# Patient Record
Sex: Female | Born: 2005 | Race: White | Hispanic: No | Marital: Single | Smoking: Never smoker
Health system: Southern US, Community
[De-identification: ages and names within clinical notes are randomized; demographics above are authoritative.]

## PROBLEM LIST (undated history)

## (undated) DIAGNOSIS — I10 Essential (primary) hypertension: Secondary | ICD-10-CM

## (undated) DIAGNOSIS — R519 Headache, unspecified: Secondary | ICD-10-CM

## (undated) DIAGNOSIS — Z8759 Personal history of other complications of pregnancy, childbirth and the puerperium: Secondary | ICD-10-CM

## (undated) DIAGNOSIS — Z2839 Other underimmunization status: Secondary | ICD-10-CM

## (undated) DIAGNOSIS — O09899 Supervision of other high risk pregnancies, unspecified trimester: Secondary | ICD-10-CM

## (undated) DIAGNOSIS — F419 Anxiety disorder, unspecified: Secondary | ICD-10-CM

## (undated) HISTORY — DX: Other underimmunization status: Z28.39

## (undated) HISTORY — DX: Essential (primary) hypertension: I10

## (undated) HISTORY — DX: Headache, unspecified: R51.9

## (undated) HISTORY — DX: Personal history of other complications of pregnancy, childbirth and the puerperium: Z87.59

## (undated) HISTORY — PX: NO PAST SURGERIES: SHX2092

## (undated) HISTORY — DX: Anxiety disorder, unspecified: F41.9

## (undated) HISTORY — DX: Supervision of other high risk pregnancies, unspecified trimester: O09.899

---
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
## 2023-01-05 ENCOUNTER — Encounter: Payer: Self-pay | Admitting: *Deleted

## 2023-01-06 ENCOUNTER — Telehealth: Payer: Self-pay

## 2023-01-06 NOTE — Telephone Encounter (Signed)
Mychart msg sent. AS, CMA

## 2023-01-16 DIAGNOSIS — Z419 Encounter for procedure for purposes other than remedying health state, unspecified: Secondary | ICD-10-CM | POA: Diagnosis not present

## 2023-02-02 ENCOUNTER — Other Ambulatory Visit (HOSPITAL_BASED_OUTPATIENT_CLINIC_OR_DEPARTMENT_OTHER): Payer: Self-pay | Admitting: *Deleted

## 2023-02-02 MED ORDER — LABETALOL HCL 100 MG PO TABS: 100.0000 mg | ORAL_TABLET | Freq: Two times a day (BID) | ORAL | 0 refills | Status: DC

## 2023-02-16 DIAGNOSIS — Z419 Encounter for procedure for purposes other than remedying health state, unspecified: Secondary | ICD-10-CM | POA: Diagnosis not present

## 2023-02-22 ENCOUNTER — Encounter (HOSPITAL_BASED_OUTPATIENT_CLINIC_OR_DEPARTMENT_OTHER): Payer: Self-pay | Admitting: Obstetrics & Gynecology

## 2023-02-23 ENCOUNTER — Other Ambulatory Visit (HOSPITAL_BASED_OUTPATIENT_CLINIC_OR_DEPARTMENT_OTHER): Payer: Self-pay | Admitting: Obstetrics & Gynecology

## 2023-02-23 ENCOUNTER — Telehealth (HOSPITAL_BASED_OUTPATIENT_CLINIC_OR_DEPARTMENT_OTHER): Payer: Self-pay | Admitting: Obstetrics & Gynecology

## 2023-02-23 DIAGNOSIS — I10 Essential (primary) hypertension: Secondary | ICD-10-CM

## 2023-02-23 NOTE — Telephone Encounter (Signed)
Pt sent mychart message with question about elevated blood pressure.  She is having some headaches as well.  Just recently switched to a combination OCP.  Advised to stop this and restart the micronor.  Back up method for 7 days after switching recommended.  Irregular bleeding may occur after stopping the combination OCPs early.  Also, recommended rechecking blood pressure in one week and giving me update.  She is taking labetolol BID.  Has stopped breast feeding so this could be adjusted if needed.  Referral to PCP placed as well for pt to get established.

## 2023-03-18 DIAGNOSIS — Z419 Encounter for procedure for purposes other than remedying health state, unspecified: Secondary | ICD-10-CM | POA: Diagnosis not present

## 2023-03-25 ENCOUNTER — Ambulatory Visit (INDEPENDENT_AMBULATORY_CARE_PROVIDER_SITE_OTHER): Payer: Medicaid Other | Admitting: Medical

## 2023-03-25 ENCOUNTER — Encounter (HOSPITAL_BASED_OUTPATIENT_CLINIC_OR_DEPARTMENT_OTHER): Payer: Self-pay | Admitting: Medical

## 2023-03-25 VITALS — BP 134/89 | HR 91 | Ht 65.0 in | Wt 220.4 lb

## 2023-03-25 DIAGNOSIS — I10 Essential (primary) hypertension: Secondary | ICD-10-CM

## 2023-03-25 DIAGNOSIS — Z0130 Encounter for examination of blood pressure without abnormal findings: Secondary | ICD-10-CM

## 2023-03-25 DIAGNOSIS — Z30011 Encounter for initial prescription of contraceptive pills: Secondary | ICD-10-CM | POA: Diagnosis not present

## 2023-03-25 MED ORDER — LISINOPRIL 10 MG PO TABS: 10.0000 mg | ORAL_TABLET | Freq: Every day | ORAL | 3 refills | Status: DC

## 2023-03-25 MED ORDER — NORETHINDRONE 0.35 MG PO TABS: 1.0000 | ORAL_TABLET | Freq: Every day | ORAL | 3 refills | Status: DC

## 2023-03-25 NOTE — Progress Notes (Signed)
  History:  Ms. Kristi Lowe is a 17 y.o. G1P1001 who presents to clinic today for BP check. She was on OCPs and noted frequent HA. She has ability to check BP at home and HTN was noted. She is on Labetalol for HTN. She is not breastfeeding. She called the office due to HA and HTN and was told to go back to taking Micronor, however pharmacy gave her the wrong Rx so she hasn't been on any birth control since 02/23/23. LMP 03/21/23 was normal, and condoms have been used, so we can be reasonably certain that she is not pregnant and safe to restart the Micronor.  HA has been at least monthly and associated most recently with BP 140s/90s at home. She denies visual changes or chest pain.   The following portions of the patient's history were reviewed and updated as appropriate: allergies, current medications, family history, past medical history, social history, past surgical history and problem list.  Review of Systems:  Review of Systems  Cardiovascular:  Negative for chest pain.  Gastrointestinal:  Negative for abdominal pain.  Genitourinary:        Neg - vaginal bleeding  Neurological:  Positive for headaches.      Objective:  Physical Exam BP 134/89 (BP Location: Right Arm, Patient Position: Sitting, Cuff Size: Large)   Pulse 91   Ht 5\' 5"  (1.651 m) Comment: Reported  Wt 220 lb 6.4 oz (100 kg)   LMP 03/21/2023   BMI 36.68 kg/m  Physical Exam Vitals reviewed.  Constitutional:      Appearance: Normal appearance. She is obese. She is not ill-appearing.  Cardiovascular:     Rate and Rhythm: Normal rate.  Pulmonary:     Effort: Pulmonary effort is normal.  Abdominal:     General: Abdomen is flat.     Palpations: Abdomen is soft.  Skin:    General: Skin is warm and dry.     Findings: No erythema.  Neurological:     Mental Status: She is alert and oriented to person, place, and time.  Psychiatric:        Mood and Affect: Mood normal.     Health Maintenance Due  Topic Date Due    COVID-19 Vaccine (1) Never done   HPV VACCINES (1 - 2-dose series) Never done    Labs, imaging and previous visits in Epic and Care Everywhere reviewed  Assessment & Plan:  1. BP check - BP normal today, but not well-controlled at home on current regimen of Labetalol, will switch to Lisinopril since not breastfeeding as this should provide better control. Patient is agreeable.   2. Hypertension, unspecified type - lisinopril (PRINIVIL) 10 MG tablet; Take 1 tablet (10 mg total) by mouth daily.  Dispense: 30 tablet; Refill: 3 - Ambulatory referral to [facility] - Warning signs for worsening HTN discussed as well as when to seek emergency medical care   3. Encounter for initial prescription of contraceptive pills - norethindrone (MICRONOR) 0.35 MG tablet; Take 1 tablet (0.35 mg total) by mouth daily.  Dispense: 84 tablet; Refill: 3 - Restart POPs on Sunday and use condoms for at least the first month to ensure adequate coverage.    Return in about 2 weeks (around 04/08/2023) for BP check with RN/CMA only .  Marny Lowenstein, PA-C 03/25/2023 3:26 PM

## 2023-03-26 ENCOUNTER — Ambulatory Visit (HOSPITAL_BASED_OUTPATIENT_CLINIC_OR_DEPARTMENT_OTHER): Payer: Self-pay | Admitting: Obstetrics & Gynecology

## 2023-04-02 ENCOUNTER — Encounter (HOSPITAL_BASED_OUTPATIENT_CLINIC_OR_DEPARTMENT_OTHER): Payer: Medicaid Other

## 2023-04-02 ENCOUNTER — Telehealth (HOSPITAL_BASED_OUTPATIENT_CLINIC_OR_DEPARTMENT_OTHER): Payer: Self-pay | Admitting: *Deleted

## 2023-04-02 NOTE — Telephone Encounter (Signed)
Called patient and left a message to see if she was on her way to get her BP.

## 2023-04-08 ENCOUNTER — Ambulatory Visit (HOSPITAL_BASED_OUTPATIENT_CLINIC_OR_DEPARTMENT_OTHER): Payer: Medicaid Other | Admitting: Family Medicine

## 2023-04-08 ENCOUNTER — Encounter (HOSPITAL_BASED_OUTPATIENT_CLINIC_OR_DEPARTMENT_OTHER): Payer: Self-pay | Admitting: Family Medicine

## 2023-04-15 ENCOUNTER — Ambulatory Visit (HOSPITAL_BASED_OUTPATIENT_CLINIC_OR_DEPARTMENT_OTHER): Payer: Medicaid Other | Admitting: Family Medicine

## 2023-04-16 ENCOUNTER — Ambulatory Visit (INDEPENDENT_AMBULATORY_CARE_PROVIDER_SITE_OTHER): Payer: Medicaid Other | Admitting: Family Medicine

## 2023-04-16 ENCOUNTER — Encounter (HOSPITAL_BASED_OUTPATIENT_CLINIC_OR_DEPARTMENT_OTHER): Payer: Self-pay | Admitting: Family Medicine

## 2023-04-16 VITALS — BP 135/80 | HR 81 | Ht 65.0 in | Wt 224.8 lb

## 2023-04-16 DIAGNOSIS — R635 Abnormal weight gain: Secondary | ICD-10-CM | POA: Diagnosis not present

## 2023-04-16 DIAGNOSIS — F411 Generalized anxiety disorder: Secondary | ICD-10-CM

## 2023-04-16 DIAGNOSIS — I10 Essential (primary) hypertension: Secondary | ICD-10-CM | POA: Diagnosis not present

## 2023-04-16 NOTE — Progress Notes (Signed)
New Patient Office Visit  Subjective    Patient ID: Kristi Lowe, female    DOB: October 16, 2006  Age: 17 y.o. MRN: [id number]  HPI Kristi Lowe is a 17 year-old female who presents to establish care. She has a history of generalized anxiety disorder and hypertension. She is currently on progestin-only contraception Geophysicist/field seismologist). She has concerns about recent weight gain, despite healthy diet and daily exercise. She has a 18 month-old girl who is a well tempered and is sleeping well throughout the night.   OCP- [facility]   HTN- was taking labetalol 100mg  switched to lisinopril 10mg  daily. 03/25/2023: office visit with OBGYN for BP follow-up, she was not informed to stop taking the labetalol and start taking the lisinopril. She did not understand these instructions and had multiple things happen and was unable to contact the office. Therefore, she has been taking labetalol 100mg  twice daily. Patient reports she is not currently breastfeeding. Reports her home blood pressure readings are in the 130-140s/80-90s.   GAD- not currently taking any medications. She is dealing with an increase in life stressors- her mom is currently in the hospital.      04/16/2023    8:38 AM 06/13/2022    9:55 AM 02/11/2022    3:53 PM  GAD 7 : Generalized Anxiety Score  Nervous, Anxious, on Edge 0 0 0  Control/stop worrying 0 0 0  Worry too much - different things 0 0 0  Trouble relaxing 0 0 0  Restless 0 0 0  Easily annoyed or irritable 0 1 0  Afraid - awful might happen 0 0 0  Total GAD 7 Score 0 1 0  Anxiety Difficulty Not difficult at all         04/16/2023    8:38 AM 03/25/2023    3:09 PM 06/13/2022    9:55 AM 02/11/2022    3:53 PM  Depression screen PHQ 2/9  Decreased Interest 0 0 0 0  Down, Depressed, Hopeless 0 0 0 0  PHQ - 2 Score 0 0 0 0  Altered sleeping   0 0  Tired, decreased energy   1 1  Change in appetite   0 0  Feeling bad or failure about yourself    0 0  Trouble concentrating   0 0  Moving  slowly or fidgety/restless   0 0  Suicidal thoughts   0 0  PHQ-9 Score   1 1     Outpatient Encounter Medications as of 04/16/2023  Medication Sig   lisinopril (PRINIVIL) 10 MG tablet Take 1 tablet (10 mg total) by mouth daily.   norethindrone (MICRONOR) 0.35 MG tablet Take 1 tablet (0.35 mg total) by mouth daily.   [DISCONTINUED] labetalol (NORMODYNE) 100 MG tablet Take 1 tablet (100 mg total) by mouth 2 (two) times daily.   [DISCONTINUED] docusate sodium (COLACE) 100 MG capsule Take 1 capsule (100 mg total) by mouth 2 (two) times daily. (Patient not taking: Reported on 10/02/2022)   [DISCONTINUED] ibuprofen (ADVIL) 600 MG tablet Take 1 tablet (600 mg total) by mouth every 6 (six) hours. (Patient not taking: Reported on 03/25/2023)   [DISCONTINUED] Prenatal Vit-Fe Fumarate-FA (PRENATAL VITAMINS) 28-0.8 MG TABS Take 1 tablet by mouth daily. (Patient not taking: Reported on 03/25/2023)   No facility-administered encounter medications on file as of 04/16/2023.    Past Medical History:  Diagnosis Date   Anxiety    Headache    History of gestational hypertension    Rubella non-immune status, antepartum  Past Surgical History:  Procedure Laterality Date   NO PAST SURGERIES      Family History  Problem Relation Age of Onset   Anxiety disorder Mother    ADD / ADHD Mother    Anxiety disorder Maternal Grandmother    Migraines Maternal Grandmother    Aneurysm Maternal Grandmother    Hypertension Paternal Grandfather    Asthma Neg Hx    Cancer Neg Hx    Diabetes Neg Hx    Heart disease Neg Hx     Social History   Socioeconomic History   Marital status: Single    Spouse name: Not on file   Number of children: Not on file   Years of education: Not on file   Highest education level: Not on file  Occupational History    Comment: [employer]  Tobacco Use   Smoking status: Never    Passive exposure: Never   Smokeless tobacco: Never  Vaping Use   Vaping Use: Never used  Substance  and Sexual Activity   Alcohol use: Never   Drug use: Never   Sexual activity: Yes    Birth control/protection: OCP    Comment: POPs  Other Topics Concern   Not on file  Social History Narrative   Danilyn lives with mom, dad, and sister.    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 She does not the best, but not the worst in school.    She would like to go to a community college and the transfer to a university for Public relations account executive.    Social Determinants of Health   Financial Resource Strain: Low Risk  (02/11/2022)   Overall Financial Resource Strain (CARDIA)    Difficulty of Paying Living Expenses: Not hard at all  Food Insecurity: No Food Insecurity (08/19/2022)   Hunger Vital Sign    Worried About Running Out of Food in the Last Year: Never true    Ran Out of Food in the Last Year: Never true  Transportation Needs: No Transportation Needs (08/19/2022)   PRAPARE - Administrator, Civil Service (Medical): No    Lack of Transportation (Non-Medical): No  Physical Activity: Sufficiently Active (02/11/2022)   Exercise Vital Sign    Days of Exercise per Week: 6 days    Minutes of Exercise per Session: 30 min  Stress: No Stress Concern Present (02/11/2022)   [employer] of Occupational Health - Occupational Stress Questionnaire    Feeling of Stress : Not at all  Social Connections: Socially Isolated (02/11/2022)   Social Connection and Isolation Panel [NHANES]    Frequency of Communication with Friends and Family: More than three times a week    Frequency of Social Gatherings with Friends and Family: Three times a week    Attends Religious Services: Never    Active Member of Clubs or Organizations: No    Attends Banker Meetings: Never    Marital Status: Never married  Intimate Partner Violence: Not At Risk (08/19/2022)   Humiliation, Afraid, Rape, and Kick questionnaire    Fear of Current or Ex-Partner: No    Emotionally Abused: No    Physically  Abused: No    Sexually Abused: No    Review of Systems  Constitutional:  Negative for malaise/fatigue.  Eyes:  Negative for blurred vision and double vision.  Respiratory:  Negative for cough and shortness of breath.   Cardiovascular:  Negative for chest pain, palpitations and orthopnea.  Gastrointestinal:  Negative for  abdominal pain, nausea and vomiting.  Musculoskeletal:  Negative for myalgias.  Neurological:  Negative for dizziness, weakness and headaches.  Psychiatric/Behavioral:  Negative for depression, substance abuse and suicidal ideas. The patient is not nervous/anxious and does not have insomnia.    Objective    BP 135/80   Pulse 81   Ht 5\' 5"  (1.651 m)   Wt 224 lb 12.8 oz (102 kg)   LMP 03/21/2023 (Approximate)   SpO2 100%   Breastfeeding No   BMI 37.41 kg/m   Physical Exam Constitutional:      Appearance: Normal appearance.  Cardiovascular:     Rate and Rhythm: Normal rate and regular rhythm.     Pulses: Normal pulses.     Heart sounds: Normal heart sounds.  Pulmonary:     Effort: Pulmonary effort is normal.     Breath sounds: Normal breath sounds.  Neurological:     Mental Status: She is alert.  Psychiatric:        Mood and Affect: Mood normal.        Behavior: Behavior normal.        Thought Content: Thought content normal.        Judgment: Judgment normal.    Assessment & Plan:   1. Hypertension, unspecified type Patient presents today with well-controlled blood pressure. Patient in no acute distress and is well-appearing. Denies chest pain, shortness of breath, lower extremity edema, vision changes, headaches. Cardiovascular exam with heart regular rate and rhythm. Normal heart sounds, no murmurs present. No lower extremity edema present. Lungs clear to auscultation bilaterally. Patient is currently taking labetalol 100mg  twice daily. Discussed starting lisinopril 10mg  daily. Advised her to continue monitoring blood pressures at home- blood pressure  log provided to patient. No refills needed. Return to office sooner if blood pressure begins to increase greater than 130/80. Follow-up in 4 weeks. Will assess kidney function prior to starting lisinopril.  - Comprehensive metabolic panel  2. Weight gain Patient reports she has noticed an increase in weight over the past few months. She reports that she eats a healthy variety of foods, limits unhealthy foods, and exercises daily. She reports that she did stop breastfeeding recently. Discussed this may contribute to an increase in her weight. Will assess her thyroid function today.  - TSH Rfx on Abnormal to Free T4  3. Generalized anxiety disorder Patient reports she has a history of generalized anxiety disorder. GAD7 and PHQ2 completed with a score of 0 on each questionnaire. Denies SI/HI. She reports she is dealing with an increase in life stressors and would like to be referred for cognitive behavioral therapy (CBT). Advised her to return to office if she starts to feel her anxiety is impacting her quality of life and daily activities. Will follow-up at visit in 4 weeks to discuss status of referral.  - Ambulatory referral to Psychology    Return in about 4 weeks (around 05/14/2023) for HTN & mood follow-up.   Alyson Reedy, FNP

## 2023-04-17 ENCOUNTER — Encounter (HOSPITAL_BASED_OUTPATIENT_CLINIC_OR_DEPARTMENT_OTHER): Payer: Self-pay | Admitting: Family Medicine

## 2023-04-17 LAB — COMPREHENSIVE METABOLIC PANEL
ALT: 18 IU/L (ref 0–32)
AST: 12 IU/L (ref 0–40)
Albumin/Globulin Ratio: 1.7 (ref 1.2–2.2)
Albumin: 4.3 g/dL (ref 4.0–5.0)
Alkaline Phosphatase: 107 IU/L — ABNORMAL HIGH (ref 42–106)
BUN/Creatinine Ratio: 21 (ref 9–23)
BUN: 15 mg/dL (ref 6–20)
Bilirubin Total: 0.2 mg/dL (ref 0.0–1.2)
CO2: 21 mmol/L (ref 20–29)
Calcium: 9.5 mg/dL (ref 8.7–10.2)
Chloride: 105 mmol/L (ref 96–106)
Creatinine, Ser: 0.71 mg/dL (ref 0.57–1.00)
Globulin, Total: 2.6 g/dL (ref 1.5–4.5)
Glucose: 105 mg/dL — ABNORMAL HIGH (ref 70–99)
Potassium: 4.8 mmol/L (ref 3.5–5.2)
Sodium: 141 mmol/L (ref 134–144)
Total Protein: 6.9 g/dL (ref 6.0–8.5)
eGFR: 126 mL/min/{1.73_m2} (ref >59)

## 2023-04-17 LAB — TSH RFX ON ABNORMAL TO FREE T4: TSH: 1.19 u[IU]/mL (ref 0.450–4.500)

## 2023-04-18 DIAGNOSIS — Z419 Encounter for procedure for purposes other than remedying health state, unspecified: Secondary | ICD-10-CM | POA: Diagnosis not present

## 2023-04-29 ENCOUNTER — Encounter (HOSPITAL_BASED_OUTPATIENT_CLINIC_OR_DEPARTMENT_OTHER): Payer: Self-pay | Admitting: Family Medicine

## 2023-05-01 ENCOUNTER — Ambulatory Visit (HOSPITAL_BASED_OUTPATIENT_CLINIC_OR_DEPARTMENT_OTHER): Payer: Medicaid Other | Admitting: Family Medicine

## 2023-05-06 ENCOUNTER — Encounter (HOSPITAL_BASED_OUTPATIENT_CLINIC_OR_DEPARTMENT_OTHER): Payer: Self-pay | Admitting: Family Medicine

## 2023-05-06 ENCOUNTER — Telehealth (INDEPENDENT_AMBULATORY_CARE_PROVIDER_SITE_OTHER): Payer: Medicaid Other | Admitting: Family Medicine

## 2023-05-06 VITALS — BP 115/108 | HR 79 | Ht 65.0 in | Wt 225.0 lb

## 2023-05-06 DIAGNOSIS — F411 Generalized anxiety disorder: Secondary | ICD-10-CM

## 2023-05-06 DIAGNOSIS — I10 Essential (primary) hypertension: Secondary | ICD-10-CM

## 2023-05-06 MED ORDER — FLUOXETINE HCL 10 MG PO TABS: 10.0000 mg | ORAL_TABLET | Freq: Every day | ORAL | 2 refills | Status: DC

## 2023-05-06 MED ORDER — LISINOPRIL 20 MG PO TABS: 20.0000 mg | ORAL_TABLET | Freq: Every day | ORAL | 2 refills | Status: DC

## 2023-05-06 NOTE — Progress Notes (Unsigned)
Virtual Visit via Video Note  I connected with Kristi Lowe on 05/07/23 at 8:00 AM by a video enabled telemedicine application and verified that I am speaking with the correct person using two identifiers. She presents for concerns about anxiety.    Patient Location: Home Provider Location: Office/Clinic  I discussed the limitations, risks, security, and privacy concerns of performing an evaluation and management service by video and the availability of in person appointments. I also discussed with the patient that there may be a patient responsible charge related to this service. The patient expressed understanding and agreed to proceed.  Reports she is doing "about the same" as her last visit. Reports she has noticed an increase in her anxiety.  GAD7 score today of 4. PHQ9 score of 0 Notices physical manifestations of her anxiety depending on what she is doing- difficulty driving, "feeling it" in her chest, "stomach turns," heart rate increases. Sometimes she notices her anxiety increase when she is not doing anything. Has not been on medications in the past. In college now, going to school and attending in-person class makes her nervous and makes her anxiety increase. She has been on propranolol in the past for headaches but noticed this did help improve her symptoms.   BP at home today- 125/107      05/06/2023    1:41 PM 04/16/2023    8:38 AM 06/13/2022    9:55 AM 02/11/2022    3:53 PM  GAD 7 : Generalized Anxiety Score  Nervous, Anxious, on Edge 2 0 0 0  Control/stop worrying 0 0 0 0  Worry too much - different things 2 0 0 0  Trouble relaxing 0 0 0 0  Restless 0 0 0 0  Easily annoyed or irritable 0 0 1 0  Afraid - awful might happen 0 0 0 0  Total GAD 7 Score 4 0 1 0  Anxiety Difficulty Not difficult at all Not difficult at all          05/06/2023    1:42 PM 04/16/2023    8:38 AM 03/25/2023    3:09 PM 06/13/2022    9:55 AM 02/11/2022    3:53 PM  Depression screen PHQ 2/9   Decreased Interest 0 0 0 0 0  Down, Depressed, Hopeless 0 0 0 0 0  PHQ - 2 Score 0 0 0 0 0  Altered sleeping 0   0 0  Tired, decreased energy 0   1 1  Change in appetite 0   0 0  Feeling bad or failure about yourself  0   0 0  Trouble concentrating 0   0 0  Moving slowly or fidgety/restless 0   0 0  Suicidal thoughts 0   0 0  PHQ-9 Score 0   1 1  Difficult doing work/chores Not difficult at all        ROS: Per HPI  Current Outpatient Medications:    FLUoxetine (PROZAC) 10 MG tablet, Take 1 tablet (10 mg total) by mouth daily., Disp: 30 tablet, Rfl: 2   norethindrone (MICRONOR) 0.35 MG tablet, Take 1 tablet (0.35 mg total) by mouth daily., Disp: 84 tablet, Rfl: 3   lisinopril (PRINIVIL) 20 MG tablet, Take 1 tablet (20 mg total) by mouth daily., Disp: 30 tablet, Rfl: 2  Observations/Objective: Today's Vitals   05/06/23 1335  BP: (!) 115/108  Pulse: 79  Weight: 225 lb (102.1 kg)  Height: 5\' 5"  (1.651 m)    General: Speaking full  sentences, no audible heavy breathing.  Sounds alert and appropriately interactive.  Appears well.  Face symmetric.  Extraocular movements intact.  Pupils equal and round.  No nasal flaring or accessory muscle use visualized.  Assessment and Plan:  1. Generalized anxiety disorder Patient reports an increase in anxiety within the past month. GAD7 and PHQ9 completed. GAD7 indicates mild anxiety. PHQ9 completed with score of 0. Denies SI/HI. Discussed options, would like to proceed with starting first-line medication. Educated about mechanism of action, common side effects, daily adherence to medication, and that improvement in mood may take 4-6 weeks. Placed ordered for fluoxetine 10mg  daily. Plan to follow-up in 4-6 weeks for mood.   - FLUoxetine (PROZAC) 10 MG tablet; Take 1 tablet (10 mg total) by mouth daily.  Dispense: 30 tablet; Refill: 2  2. Hypertension, unspecified type Patient has elevated diastolic blood pressure at home. Patient in no acute  distress and is well-appearing. Denies chest pain, shortness of breath, lower extremity edema, vision changes, headaches. Advised patient to closely monitor blood pressure. Plan to increase lisinopril from 10mg  daily to 20mg  daily.  Advised patient to continue to monitor blood pressures at home and return to office sooner if blood pressure begins to increase greater than 130/80. Follow-up in 4 weeks.     Follow Up Instructions: Return in about 4 weeks (around 06/03/2023) for Mood f/u, HTN follow-up.   I discussed the assessment and treatment plan with the patient. The patient was provided an opportunity to ask questions, and all were answered. The patient agreed with the plan and demonstrated an understanding of the instructions.   The patient was advised to call back or seek an in-person evaluation if the symptoms worsen or if the condition fails to improve as anticipated.  The above assessment and management plan was discussed with the patient. The patient verbalized understanding of and has agreed to the management plan.   Alyson Reedy, FNP

## 2023-05-08 ENCOUNTER — Ambulatory Visit (HOSPITAL_BASED_OUTPATIENT_CLINIC_OR_DEPARTMENT_OTHER): Payer: Medicaid Other | Admitting: Family Medicine

## 2023-05-08 ENCOUNTER — Encounter (HOSPITAL_BASED_OUTPATIENT_CLINIC_OR_DEPARTMENT_OTHER): Payer: Self-pay | Admitting: Family Medicine

## 2023-05-13 ENCOUNTER — Encounter (HOSPITAL_BASED_OUTPATIENT_CLINIC_OR_DEPARTMENT_OTHER): Payer: Self-pay

## 2023-05-13 ENCOUNTER — Other Ambulatory Visit (HOSPITAL_BASED_OUTPATIENT_CLINIC_OR_DEPARTMENT_OTHER): Payer: Self-pay | Admitting: Family Medicine

## 2023-05-13 MED ORDER — SERTRALINE HCL 25 MG PO TABS: 25.0000 mg | ORAL_TABLET | Freq: Every day | ORAL | 2 refills | Status: DC

## 2023-05-14 ENCOUNTER — Other Ambulatory Visit (HOSPITAL_BASED_OUTPATIENT_CLINIC_OR_DEPARTMENT_OTHER): Payer: Self-pay

## 2023-05-14 ENCOUNTER — Encounter (HOSPITAL_BASED_OUTPATIENT_CLINIC_OR_DEPARTMENT_OTHER): Payer: Self-pay | Admitting: Family Medicine

## 2023-05-15 ENCOUNTER — Ambulatory Visit (HOSPITAL_BASED_OUTPATIENT_CLINIC_OR_DEPARTMENT_OTHER): Payer: Medicaid Other | Admitting: Family Medicine

## 2023-05-18 DIAGNOSIS — Z419 Encounter for procedure for purposes other than remedying health state, unspecified: Secondary | ICD-10-CM | POA: Diagnosis not present

## 2023-05-29 ENCOUNTER — Encounter (HOSPITAL_BASED_OUTPATIENT_CLINIC_OR_DEPARTMENT_OTHER): Payer: Self-pay | Admitting: Family Medicine

## 2023-06-06 DIAGNOSIS — Z111 Encounter for screening for respiratory tuberculosis: Secondary | ICD-10-CM | POA: Diagnosis not present

## 2023-06-09 ENCOUNTER — Encounter (HOSPITAL_BASED_OUTPATIENT_CLINIC_OR_DEPARTMENT_OTHER): Payer: Self-pay | Admitting: Family Medicine

## 2023-06-09 ENCOUNTER — Other Ambulatory Visit (HOSPITAL_BASED_OUTPATIENT_CLINIC_OR_DEPARTMENT_OTHER): Payer: Self-pay

## 2023-06-09 DIAGNOSIS — I10 Essential (primary) hypertension: Secondary | ICD-10-CM

## 2023-06-09 MED ORDER — FLUOXETINE HCL 10 MG PO TABS: 10.0000 mg | ORAL_TABLET | Freq: Every day | ORAL | 0 refills | Status: DC

## 2023-06-09 MED ORDER — LISINOPRIL 20 MG PO TABS: 20.0000 mg | ORAL_TABLET | Freq: Every day | ORAL | 2 refills | Status: DC

## 2023-06-18 DIAGNOSIS — Z419 Encounter for procedure for purposes other than remedying health state, unspecified: Secondary | ICD-10-CM | POA: Diagnosis not present

## 2023-06-24 ENCOUNTER — Ambulatory Visit (INDEPENDENT_AMBULATORY_CARE_PROVIDER_SITE_OTHER): Payer: Medicaid Other | Admitting: Family Medicine

## 2023-06-24 ENCOUNTER — Encounter (HOSPITAL_BASED_OUTPATIENT_CLINIC_OR_DEPARTMENT_OTHER): Payer: Self-pay | Admitting: Family Medicine

## 2023-06-24 VITALS — BP 128/70 | Ht 65.0 in | Wt 220.0 lb

## 2023-06-24 DIAGNOSIS — F411 Generalized anxiety disorder: Secondary | ICD-10-CM | POA: Diagnosis not present

## 2023-06-24 MED ORDER — FLUOXETINE HCL 20 MG PO TABS: 20.0000 mg | ORAL_TABLET | Freq: Every day | ORAL | 2 refills | Status: DC

## 2023-06-24 NOTE — Progress Notes (Signed)
Established Patient Office Visit  Subjective   Patient ID: Kristi Lowe, female    DOB: November 08, 2006  Age: 17 y.o. MRN: [id number]  ANXIETY:  Kristi Lowe is a 17 year-old female patient who presents for the medical management of anxiety.  Current medication regimen: prozac 10mg , no side effects with medication  Counseling: trying to schedule, interested  Well controlled: does not see much of a difference with current dose  Denies SI/HI.      06/24/2023    8:50 AM 05/06/2023    1:41 PM 04/16/2023    8:38 AM 06/13/2022    9:55 AM  GAD 7 : Generalized Anxiety Score  Nervous, Anxious, on Edge 2 2 0 0  Control/stop worrying 1 0 0 0  Worry too much - different things 1 2 0 0  Trouble relaxing 0 0 0 0  Restless 0 0 0 0  Easily annoyed or irritable 0 0 0 1  Afraid - awful might happen 0 0 0 0  Total GAD 7 Score 4 4 0 1  Anxiety Difficulty Not difficult at all Not difficult at all Not difficult at all       06/24/2023    8:50 AM 05/06/2023    1:42 PM 04/16/2023    8:38 AM  PHQ9 SCORE ONLY  PHQ-9 Total Score 0 0 0    Review of Systems  Constitutional:  Negative for malaise/fatigue.  Eyes:  Negative for blurred vision and double vision.  Respiratory:  Negative for cough and shortness of breath.   Cardiovascular:  Negative for chest pain and palpitations.  Gastrointestinal:  Negative for abdominal pain, nausea and vomiting.  Musculoskeletal:  Negative for myalgias.  Neurological:  Negative for dizziness, weakness and headaches.  Psychiatric/Behavioral:  Negative for depression and suicidal ideas. The patient is nervous/anxious. The patient does not have insomnia.       Objective:     BP 128/70   Ht 5\' 5"  (1.651 m)   Wt 220 lb (99.8 kg)   SpO2 100%   BMI 36.61 kg/m  BP Readings from Last 3 Encounters:  06/24/23 128/70  05/06/23 (!) 115/108  04/16/23 135/80     Physical Exam Constitutional:      Appearance: Normal appearance.  Cardiovascular:     Rate and Rhythm:  Normal rate and regular rhythm.     Pulses: Normal pulses.     Heart sounds: Normal heart sounds.  Pulmonary:     Effort: Pulmonary effort is normal.     Breath sounds: Normal breath sounds.  Neurological:     Mental Status: She is alert.  Psychiatric:        Mood and Affect: Mood normal.        Behavior: Behavior normal.        Thought Content: Thought content normal.        Judgment: Judgment normal.       Assessment & Plan:  Generalized anxiety disorder Assessment & Plan: Patient presents today for medical management of anxiety. She has been taking fluoxetine 10mg  daily as prescribed with no adverse side effects. GAD7 score completed today, with no improvement from last visit. Patient reports she does not notice a worsening in her mood, but also does not notice her mood worsening. Discussed options, she would like to increase dose from 10mg  to 20mg . Refill sent to pharmacy. Plan to follow-up in 6 weeks for mood.  Orders: -     Ambulatory referral to Behavioral Health  Other orders -  FLUoxetine HCl; Take 1 tablet (20 mg total) by mouth daily.  Dispense: 30 tablet; Refill: 2     Return in about 6 weeks (around 08/05/2023) for Mood f/u.    Alyson Reedy, FNP

## 2023-06-24 NOTE — Assessment & Plan Note (Addendum)
Patient presents today for medical management of anxiety. She has been taking fluoxetine 10mg  daily as prescribed with no adverse side effects. GAD7 score completed today, with no improvement from last visit. Patient reports she does not notice a worsening in her mood, but also does not notice her mood worsening. Discussed options, she would like to increase dose from 10mg  to 20mg . Refill sent to pharmacy. Plan to follow-up in 6 weeks for mood.

## 2023-07-14 ENCOUNTER — Encounter (HOSPITAL_BASED_OUTPATIENT_CLINIC_OR_DEPARTMENT_OTHER): Payer: Self-pay | Admitting: Family Medicine

## 2023-07-14 ENCOUNTER — Other Ambulatory Visit (HOSPITAL_BASED_OUTPATIENT_CLINIC_OR_DEPARTMENT_OTHER): Payer: Self-pay

## 2023-07-14 DIAGNOSIS — I10 Essential (primary) hypertension: Secondary | ICD-10-CM

## 2023-07-14 MED ORDER — LISINOPRIL 20 MG PO TABS: 20.0000 mg | ORAL_TABLET | Freq: Every day | ORAL | 2 refills | Status: DC

## 2023-07-19 DIAGNOSIS — Z419 Encounter for procedure for purposes other than remedying health state, unspecified: Secondary | ICD-10-CM | POA: Diagnosis not present

## 2023-08-05 ENCOUNTER — Ambulatory Visit (HOSPITAL_BASED_OUTPATIENT_CLINIC_OR_DEPARTMENT_OTHER): Payer: Medicaid Other | Admitting: Family Medicine

## 2023-08-18 DIAGNOSIS — Z419 Encounter for procedure for purposes other than remedying health state, unspecified: Secondary | ICD-10-CM | POA: Diagnosis not present

## 2023-08-25 ENCOUNTER — Ambulatory Visit (HOSPITAL_COMMUNITY): Payer: Medicaid Other | Admitting: Mental Health

## 2023-09-17 ENCOUNTER — Encounter (HOSPITAL_BASED_OUTPATIENT_CLINIC_OR_DEPARTMENT_OTHER): Payer: Self-pay | Admitting: Family Medicine

## 2023-09-18 DIAGNOSIS — Z419 Encounter for procedure for purposes other than remedying health state, unspecified: Secondary | ICD-10-CM | POA: Diagnosis not present

## 2023-09-22 ENCOUNTER — Ambulatory Visit (HOSPITAL_BASED_OUTPATIENT_CLINIC_OR_DEPARTMENT_OTHER): Payer: Medicaid Other | Admitting: Family Medicine

## 2023-09-22 ENCOUNTER — Encounter (HOSPITAL_BASED_OUTPATIENT_CLINIC_OR_DEPARTMENT_OTHER): Payer: Self-pay | Admitting: Family Medicine

## 2023-10-01 ENCOUNTER — Ambulatory Visit (HOSPITAL_BASED_OUTPATIENT_CLINIC_OR_DEPARTMENT_OTHER): Payer: Medicaid Other | Admitting: Obstetrics & Gynecology

## 2023-10-01 ENCOUNTER — Encounter (HOSPITAL_BASED_OUTPATIENT_CLINIC_OR_DEPARTMENT_OTHER): Payer: Self-pay | Admitting: Obstetrics & Gynecology

## 2023-10-01 VITALS — BP 125/61 | HR 89 | Wt 227.8 lb

## 2023-10-01 DIAGNOSIS — Z3202 Encounter for pregnancy test, result negative: Secondary | ICD-10-CM

## 2023-10-01 DIAGNOSIS — N926 Irregular menstruation, unspecified: Secondary | ICD-10-CM

## 2023-10-01 MED ORDER — IBUPROFEN 800 MG PO TABS: 800.0000 mg | ORAL_TABLET | Freq: Three times a day (TID) | ORAL | 0 refills | Status: DC | PRN

## 2023-10-01 MED ORDER — PRENATAL PLUS VITAMIN/MINERAL 27-1 MG PO TABS: 1.0000 | ORAL_TABLET | Freq: Every day | ORAL | 12 refills | Status: DC

## 2023-10-01 NOTE — Progress Notes (Signed)
GYNECOLOGY  VISIT  CC:   abnormal bleeding/ faint UPT at home two weeks ago  HPI: 17 y.o. G74P1001 Single White or Caucasian female here for concerns about bleeding that started this morning.  Reports about two weeks ago she missed her menstrual cycle and she took a UPT.  It was faintly positive.  She repeated it the next day and it was again faintly positive.  Hasn't been feeling great so felt this was eary pregnancy.  Daughter is 10 year old.  Not actively trying for pregnancy but not actively preventing either. l   Past Medical History:  Diagnosis Date   Anxiety    Headache    History of gestational hypertension    Rubella non-immune status, antepartum     MEDS:   Current Outpatient Medications on File Prior to Visit  Medication Sig Dispense Refill   FLUoxetine (PROZAC) 20 MG tablet Take 1 tablet (20 mg total) by mouth daily. 30 tablet 2   lisinopril (PRINIVIL) 20 MG tablet Take 1 tablet (20 mg total) by mouth daily. 30 tablet 2   norethindrone (MICRONOR) 0.35 MG tablet Take 1 tablet (0.35 mg total) by mouth daily. (Patient not taking: Reported on 10/01/2023) 84 tablet 3   No current facility-administered medications on file prior to visit.    ALLERGIES: Patient has no known allergies.  SH:  single, non smoker  Review of Systems  Constitutional: Negative.   Genitourinary:        Abnormal bleeding    PHYSICAL EXAMINATION:    BP 125/61 (BP Location: Right Arm, Patient Position: Sitting, Cuff Size: Large)   Pulse 89   Wt 227 lb 12.8 oz (103.3 kg)   BMI 37.91 kg/m     General appearance: alert, cooperative and appears stated age Lymph:  no inguinal LAD noted  Pelvic: External genitalia:  no lesions              Urethra:  normal appearing urethra with no masses, tenderness or lesions              Bartholins and Skenes: normal                 Vagina: normal mucosa without prolapse or lesions              Cervix: no lesions and blood at os, no clots noted               Bimanual Exam:  Uterus:  normal size, contour, position, consistency, mobility, non-tender              Adnexa: no mass, fullness, tenderness  Chaperone, Raechel Ache, RN, was present for exam.  Assessment/Plan: 1. Urine pregnancy test negative - pt aware UPT here is negative.  Will check HCG.  Discussed possible chemical pregnancy or false positive UPT at home.  Will follow up with additional recommendations for pt once lab work is back. - MBT is O+ - Prenatal Vit-Fe Fumarate-FA (PRENATAL PLUS VITAMIN/MINERAL) 27-1 MG TABS; Take 1 tablet by mouth daily in the afternoon.  Dispense: 30 tablet; Refill: 12  2. Abnormal bleeding in menstrual cycle - ibuprofen (ADVIL) 800 MG tablet; Take 1 tablet (800 mg total) by mouth every 8 (eight) hours as needed.  Dispense: 30 tablet; Refill: 0

## 2023-10-02 LAB — BETA HCG QUANT (REF LAB): hCG Quant: <1 m[IU]/mL

## 2023-10-05 ENCOUNTER — Other Ambulatory Visit (HOSPITAL_BASED_OUTPATIENT_CLINIC_OR_DEPARTMENT_OTHER): Payer: Self-pay | Admitting: Family Medicine

## 2023-10-06 ENCOUNTER — Other Ambulatory Visit (HOSPITAL_BASED_OUTPATIENT_CLINIC_OR_DEPARTMENT_OTHER): Payer: Self-pay | Admitting: Family Medicine

## 2023-10-07 ENCOUNTER — Other Ambulatory Visit (HOSPITAL_BASED_OUTPATIENT_CLINIC_OR_DEPARTMENT_OTHER): Payer: Medicaid Other

## 2023-10-07 ENCOUNTER — Other Ambulatory Visit (HOSPITAL_BASED_OUTPATIENT_CLINIC_OR_DEPARTMENT_OTHER): Payer: Self-pay

## 2023-10-07 MED ORDER — FLUOXETINE HCL 20 MG PO TABS: 20.0000 mg | ORAL_TABLET | Freq: Every day | ORAL | 1 refills | Status: DC

## 2023-10-08 ENCOUNTER — Encounter (HOSPITAL_BASED_OUTPATIENT_CLINIC_OR_DEPARTMENT_OTHER): Payer: Self-pay | Admitting: Family Medicine

## 2023-10-18 DIAGNOSIS — Z419 Encounter for procedure for purposes other than remedying health state, unspecified: Secondary | ICD-10-CM | POA: Diagnosis not present

## 2023-10-19 ENCOUNTER — Encounter (HOSPITAL_BASED_OUTPATIENT_CLINIC_OR_DEPARTMENT_OTHER): Payer: Self-pay | Admitting: Obstetrics & Gynecology

## 2023-10-30 ENCOUNTER — Ambulatory Visit (HOSPITAL_BASED_OUTPATIENT_CLINIC_OR_DEPARTMENT_OTHER): Payer: Medicaid Other | Admitting: Certified Nurse Midwife

## 2023-10-30 ENCOUNTER — Encounter (HOSPITAL_BASED_OUTPATIENT_CLINIC_OR_DEPARTMENT_OTHER): Payer: Self-pay | Admitting: Certified Nurse Midwife

## 2023-10-30 VITALS — BP 119/63 | HR 72 | Ht 65.0 in | Wt 230.6 lb

## 2023-10-30 DIAGNOSIS — Z30017 Encounter for initial prescription of implantable subdermal contraceptive: Secondary | ICD-10-CM | POA: Diagnosis not present

## 2023-10-30 MED ORDER — ETONOGESTREL 68 MG ~~LOC~~ IMPL
68.0000 mg | DRUG_IMPLANT | Freq: Once | SUBCUTANEOUS | Status: AC
  Administered 2023-10-30: 68 mg via SUBCUTANEOUS

## 2023-10-30 NOTE — Progress Notes (Signed)
   Kristi Lowe is a 17yo G0 here for insertion of Nexplanon subdermal contraceptive implant for contraception. She was on the pill previously. 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 Last intercourse was over 2 weeks ago and urine pregnancy test negative.    GYNECOLOGY CLINIC PROCEDURE NOTE  Kristi Lowe is a 17 y.o. G1P1001 here for Nexplanon insertion.   No other gynecologic concerns.  Nexplanon Insertion Procedure Patient identified, informed consent performed, consent signed.   Patient does understand that irregular bleeding is a very common side effect of this medication. She was advised to have backup contraception for 2 weeks after placement. Pregnancy test in clinic today was negative.  Appropriate time out taken.  Patient's left arm was prepped and draped in the usual sterile fashion.. The ruler used to measure and mark insertion area.  Patient was prepped with alcohol swab and then injected with 3 ml of 1% lidocaine.  She was prepped with betadine, Nexplanon removed from packaging,  Device confirmed in needle, then inserted full length of needle and withdrawn per handbook instructions. Nexplanon was able to palpated in the patient's arm; patient palpated the insert herself. There was minimal blood loss.  Patient insertion site covered with guaze and a pressure bandage to reduce any bruising.  The patient tolerated the procedure well and was given post procedure instructions.   Letta Kocher, CNM 11:27 AM

## 2023-11-04 ENCOUNTER — Other Ambulatory Visit (HOSPITAL_BASED_OUTPATIENT_CLINIC_OR_DEPARTMENT_OTHER): Payer: Self-pay | Admitting: Certified Nurse Midwife

## 2023-11-16 ENCOUNTER — Ambulatory Visit (INDEPENDENT_AMBULATORY_CARE_PROVIDER_SITE_OTHER): Payer: Medicaid Other | Admitting: Urgent Care

## 2023-11-16 ENCOUNTER — Encounter: Payer: Self-pay | Admitting: Urgent Care

## 2023-11-16 VITALS — BP 111/71 | HR 78 | Temp 98.6°F | Ht 66.0 in | Wt 232.8 lb

## 2023-11-16 DIAGNOSIS — Z713 Dietary counseling and surveillance: Secondary | ICD-10-CM | POA: Diagnosis not present

## 2023-11-16 DIAGNOSIS — I10 Essential (primary) hypertension: Secondary | ICD-10-CM

## 2023-11-16 DIAGNOSIS — Z30017 Encounter for initial prescription of implantable subdermal contraceptive: Secondary | ICD-10-CM

## 2023-11-16 DIAGNOSIS — Z131 Encounter for screening for diabetes mellitus: Secondary | ICD-10-CM

## 2023-11-16 DIAGNOSIS — R400 Somnolence: Secondary | ICD-10-CM | POA: Diagnosis not present

## 2023-11-16 DIAGNOSIS — R0683 Snoring: Secondary | ICD-10-CM

## 2023-11-16 DIAGNOSIS — F411 Generalized anxiety disorder: Secondary | ICD-10-CM

## 2023-11-16 DIAGNOSIS — Z135 Encounter for screening for eye and ear disorders: Secondary | ICD-10-CM

## 2023-11-16 DIAGNOSIS — R635 Abnormal weight gain: Secondary | ICD-10-CM

## 2023-11-16 HISTORY — DX: Snoring: R06.83

## 2023-11-16 LAB — CBC WITH DIFFERENTIAL/PLATELET
Basophils Absolute: 0 10*3/uL (ref 0.0–0.1)
Basophils Relative: 0.3 % (ref 0.0–3.0)
Eosinophils Absolute: 0.1 10*3/uL (ref 0.0–0.7)
Eosinophils Relative: 1.2 % (ref 0.0–5.0)
HCT: 40.1 % (ref 36.0–49.0)
Hemoglobin: 12.8 g/dL (ref 12.0–16.0)
Lymphocytes Relative: 21.9 % — ABNORMAL LOW (ref 24.0–48.0)
Lymphs Abs: 2 10*3/uL (ref 0.7–4.0)
MCHC: 31.9 g/dL (ref 31.0–37.0)
MCV: 79.3 fL (ref 78.0–98.0)
Monocytes Absolute: 0.6 10*3/uL (ref 0.1–1.0)
Monocytes Relative: 6.2 % (ref 3.0–12.0)
Neutro Abs: 6.4 10*3/uL (ref 1.4–7.7)
Neutrophils Relative %: 70.4 % (ref 43.0–71.0)
Platelets: 365 10*3/uL (ref 150.0–575.0)
RBC: 5.06 Mil/uL (ref 3.80–5.70)
RDW: 14.1 % (ref 11.4–15.5)
WBC: 9.1 10*3/uL (ref 4.5–13.5)

## 2023-11-16 LAB — COMPREHENSIVE METABOLIC PANEL
ALT: 19 U/L (ref 0–35)
AST: 15 U/L (ref 0–37)
Albumin: 4.3 g/dL (ref 3.5–5.2)
Alkaline Phosphatase: 100 U/L (ref 47–119)
BUN: 14 mg/dL (ref 6–23)
CO2: 23 meq/L (ref 19–32)
Calcium: 9.5 mg/dL (ref 8.4–10.5)
Chloride: 105 meq/L (ref 96–112)
Creatinine, Ser: 0.63 mg/dL (ref 0.40–1.20)
GFR: 128.3 mL/min (ref >60.00)
Glucose, Bld: 84 mg/dL (ref 70–99)
Potassium: 4.5 meq/L (ref 3.5–5.1)
Sodium: 137 meq/L (ref 135–145)
Total Bilirubin: 0.3 mg/dL (ref 0.2–1.2)
Total Protein: 7.4 g/dL (ref 6.0–8.3)

## 2023-11-16 LAB — HEMOGLOBIN A1C: Hgb A1c MFr Bld: 5.9 % (ref 4.6–6.5)

## 2023-11-16 LAB — TSH: TSH: 1.03 u[IU]/mL (ref 0.40–5.00)

## 2023-11-16 MED ORDER — LISINOPRIL 20 MG PO TABS: 20.0000 mg | ORAL_TABLET | Freq: Every day | ORAL | 2 refills | Status: DC

## 2023-11-16 MED ORDER — FLUOXETINE HCL 20 MG PO TABS: 20.0000 mg | ORAL_TABLET | Freq: Every day | ORAL | 1 refills | Status: DC

## 2023-11-16 NOTE — Progress Notes (Signed)
New Patient Office Visit  Subjective:  Patient ID: Kristi Lowe, female    DOB: Mar 18, 2006  Age: 17 y.o. MRN: [id number]  CC:  Chief Complaint  Patient presents with   Establish Care    New pt est care. Needs refills on Fluoxetine and Lisinopril and discuss weight loss. She would also like a referral to an eye Dr.   Discussed the use of AI software for clinical note transcription with the patient who gave verbal consent to proceed.   HPI Kristi Lowe presents to establish care.  History of Present Illness The patient, a 17 year old with a history of anxiety, postpartum hypertension, and recent weight gain, presents for a primary care consultation. She reports being on fluoxetine 20mg  for anxiety since June of the current year, which she believes has been effective and well-tolerated. She denies any depressive symptoms.  The patient also reports significant weight gain since the birth of her child in October of the previous year. 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 She reports cooking her own meals and not frequently snacking or eating out. She has a history of weight fluctuation and attributes her current weight gain to postpartum changes.  The patient is also on lisinopril for postpartum hypertension, which was diagnosed less than a year ago. She reports no issues with the medication. She also has an intrauterine device for contraception and reports no menstrual bleeding since its insertion last month.  The patient reports snoring and daytime somnolence, particularly around midday. She also has large tonsils, raising the possibility of sleep apnea. She expresses a desire to lose weight and manage her hypertension without medication if possible.   She also requests a referral for an eye examination, as she has not had one in several years.   Outpatient Encounter  Medications as of 11/16/2023  Medication Sig   [DISCONTINUED] FLUoxetine (PROZAC) 20 MG tablet Take 1 tablet (20 mg total) by mouth daily.   [DISCONTINUED] lisinopril (PRINIVIL) 20 MG tablet Take 1 tablet (20 mg total) by mouth daily.   FLUoxetine (PROZAC) 20 MG tablet Take 1 tablet (20 mg total) by mouth daily.   lisinopril (PRINIVIL) 20 MG tablet Take 1 tablet (20 mg total) by mouth daily.   Prenatal Vit-Fe Fumarate-FA (PRENATAL PLUS VITAMIN/MINERAL) 27-1 MG TABS Take 1 tablet by mouth daily in the afternoon. (Patient not taking: Reported on 11/16/2023)   [DISCONTINUED] ibuprofen (ADVIL) 800 MG tablet Take 1 tablet (800 mg total) by mouth every 8 (eight) hours as needed. (Patient not taking: Reported on 11/16/2023)   No facility-administered encounter medications on file as of 11/16/2023.    Past Medical History:  Diagnosis Date   Anxiety    Headache    History of gestational hypertension    Hypertension    Rubella non-immune status, antepartum     Past Surgical History:  Procedure Laterality Date   NO PAST SURGERIES      Family History  Problem Relation Age of Onset   Anxiety disorder Mother    ADD / ADHD Mother    Anxiety disorder Maternal Grandmother    Migraines Maternal Grandmother    Aneurysm Maternal Grandmother    Hypertension Paternal Grandfather    Asthma Neg Hx    Cancer Neg Hx    Diabetes Neg Hx    Heart disease Neg Hx     Social History   Socioeconomic History  Marital status: Single    Spouse name: Not on file   Number of children: Not on file   Years of education: Not on file   Highest education level: Some college, no degree  Occupational History    Comment: [employer]  Tobacco Use   Smoking status: Never    Passive exposure: Never   Smokeless tobacco: Never  Vaping Use   Vaping status: Never Used  Substance and Sexual Activity   Alcohol use: Never   Drug use: Never   Sexual activity: Yes    Birth control/protection: Implant    Comment:  POPs  Other Topics Concern   Not on file  Social History Narrative   Kristi Lowe lives with mom, dad, and sister.    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 She does not the best, but not the worst in school.    She would like to go to a community college and the transfer to a university for Public relations account executive.    Social Drivers of Corporate investment banker Strain: Low Risk  (11/15/2023)   Overall Financial Resource Strain (CARDIA)    Difficulty of Paying Living Expenses: Not hard at all  Food Insecurity: No Food Insecurity (11/15/2023)   Hunger Vital Sign    Worried About Running Out of Food in the Last Year: Never true    Ran Out of Food in the Last Year: Never true  Transportation Needs: No Transportation Needs (11/15/2023)   PRAPARE - Administrator, Civil Service (Medical): No    Lack of Transportation (Non-Medical): No  Physical Activity: Sufficiently Active (11/15/2023)   Exercise Vital Sign    Days of Exercise per Week: 7 days    Minutes of Exercise per Session: 30 min  Stress: No Stress Concern Present (11/15/2023)   [employer] of Occupational Health - Occupational Stress Questionnaire    Feeling of Stress : Not at all  Social Connections: Moderately Isolated (11/15/2023)   Social Connection and Isolation Panel [NHANES]    Frequency of Communication with Friends and Family: More than three times a week    Frequency of Social Gatherings with Friends and Family: Twice a week    Attends Religious Services: 1 to 4 times per year    Active Member of [employer] or Organizations: No    Attends Engineer, structural: Not on file    Marital Status: Never married  Intimate Partner Violence: Not At Risk (08/19/2022)   Humiliation, Afraid, Rape, and Kick questionnaire    Fear of Current or Ex-Partner: No    Emotionally Abused: No    Physically Abused: No    Sexually Abused: No    ROS: as noted in HPI  Objective:  BP 111/71   Pulse 78   Temp 98.6  F (37 C) (Oral)   Ht 5\' 6"  (1.676 m)   Wt 232 lb 12.8 oz (105.6 kg)   LMP 10/02/2023   SpO2 98%   BMI 37.57 kg/m   Physical Exam Vitals and nursing note reviewed.  Constitutional:      General: She is not in acute distress.    Appearance: Normal appearance. She is obese. She is not ill-appearing, toxic-appearing or diaphoretic.  HENT:     Head: Normocephalic and atraumatic.     Right Ear: Tympanic membrane, ear canal and external ear normal. There is no impacted cerumen.     Left Ear: Tympanic membrane, ear canal and external ear normal. There is no impacted  cerumen.     Nose: Nose normal.     Mouth/Throat:     Mouth: Mucous membranes are moist.     Pharynx: Oropharynx is clear. Uvula midline. No pharyngeal swelling, oropharyngeal exudate, posterior oropharyngeal erythema or uvula swelling.     Tonsils: 2+ on the right. 2+ on the left.  Eyes:     General: No scleral icterus.       Right eye: No discharge.        Left eye: No discharge.     Extraocular Movements: Extraocular movements intact.     Pupils: Pupils are equal, round, and reactive to light.  Neck:     Thyroid: No thyroid mass, thyromegaly or thyroid tenderness.  Cardiovascular:     Rate and Rhythm: Normal rate and regular rhythm.     Pulses: Normal pulses.     Heart sounds: No murmur heard. Pulmonary:     Effort: Pulmonary effort is normal. No respiratory distress.     Breath sounds: Normal breath sounds. No stridor. No wheezing or rhonchi.  Musculoskeletal:     Cervical back: Normal range of motion and neck supple. No rigidity or tenderness.     Right lower leg: No edema.     Left lower leg: No edema.  Lymphadenopathy:     Cervical: No cervical adenopathy.  Skin:    General: Skin is warm and dry.     Coloration: Skin is not jaundiced.     Findings: No bruising, erythema or rash.  Neurological:     General: No focal deficit present.     Mental Status: She is alert and oriented to person, place, and time.      Sensory: No sensory deficit.     Motor: No weakness.  Psychiatric:        Mood and Affect: Mood normal.        Behavior: Behavior normal.     Last CBC Lab Results  Component Value Date   WBC 23.5 (H) 08/21/2022   HGB 10.3 (L) 08/21/2022   HCT 30.5 (L) 08/21/2022   MCV 77.6 (L) 08/21/2022   MCH 26.2 08/21/2022   RDW 15.0 08/21/2022   PLT 227 08/21/2022   Last metabolic panel Lab Results  Component Value Date   GLUCOSE 105 (H) 04/16/2023   NA 141 04/16/2023   K 4.8 04/16/2023   CL 105 04/16/2023   CO2 21 04/16/2023   BUN 15 04/16/2023   CREATININE 0.71 04/16/2023   EGFR 126 04/16/2023   CALCIUM 9.5 04/16/2023   PROT 6.9 04/16/2023   ALBUMIN 4.3 04/16/2023   LABGLOB 2.6 04/16/2023   AGRATIO 1.7 04/16/2023   BILITOT 0.2 04/16/2023   ALKPHOS 107 (H) 04/16/2023   AST 12 04/16/2023   ALT 18 04/16/2023   ANIONGAP 10 08/19/2022   Last hemoglobin A1c Lab Results  Component Value Date   HGBA1C 5.3 02/25/2022   Last thyroid functions Lab Results  Component Value Date   TSH 1.190 04/16/2023      Assessment & Plan:  Hypertension, unspecified type Assessment & Plan: Hypertension Controlled on Lisinopril 20mg  daily. Pt understands this is pregnancy cat X; is not trying to get pregnant and recently had nexplanon placed. Pt aware that if future pregnancy desired, this medication would need to be changed. -Continue Lisinopril 20mg  daily. -Consider potential for reducing or discontinuing medication with weight loss, treatment of potential sleep apnea, and reduced sodium intake.  Orders: -     Lisinopril; Take 1 tablet (20 mg  total) by mouth daily.  Dispense: 30 tablet; Refill: 2 -     CBC with Differential/Platelet -     Hemoglobin A1c -     TSH -     Comprehensive metabolic panel  Generalized anxiety disorder Assessment & Plan: Anxiety Well controlled on Fluoxetine 20mg  daily. No signs of depression or anxiety on screening. PHQ-9 score: 0 -Continue Fluoxetine  20mg  daily. -Order annual blood work to check platelets and sodium levels.  Orders: -     FLUoxetine HCl; Take 1 tablet (20 mg total) by mouth daily.  Dispense: 30 tablet; Refill: 1  Weight gain Assessment & Plan: Weight Gain Significant weight gain post-pregnancy despite regular physical activity and perceived healthy diet. -Order thyroid function tests to rule out metabolic issues. -Advise patient to maintain a 3-day food diary for review. -Educate patient on sodium intake and its impact on weight and blood pressure. -Consider sleep apnea as a potential contributing factor to weight gain and order home sleep study.  Orders: -     CBC with Differential/Platelet -     Hemoglobin A1c -     TSH -     Comprehensive metabolic panel  Diabetes mellitus screening -     Hemoglobin A1c  Insertion of Nexplanon Assessment & Plan: Placed one month ago   Encounter for weight loss counseling Assessment & Plan: Discussed dietary modifications and increased cardiovascular activity. Pt informed on how to monitor HR, to get to goal of 200 bpm after cardiovascular activity Pt to complete diet journal to discuss upon follow up   Screening for eye condition -     Ambulatory referral to Ophthalmology  Daytime somnolence Assessment & Plan: Home sleep apnea study ordered through [facility]. Will review report for possible intervention pending results.   Snoring    Return in about 6 weeks (around 12/28/2023).   Maretta Bees, PA

## 2023-11-16 NOTE — Assessment & Plan Note (Signed)
Weight Gain Significant weight gain post-pregnancy despite regular physical activity and perceived healthy diet. -Order thyroid function tests to rule out metabolic issues. -Advise patient to maintain a 3-day food diary for review. -Educate patient on sodium intake and its impact on weight and blood pressure. -Consider sleep apnea as a potential contributing factor to weight gain and order home sleep study.

## 2023-11-16 NOTE — Patient Instructions (Signed)
I have refilled your medications as ordered.  Please keep a food diary x 3 days and bring back to your next office visit to discuss ways to improve. Cut out all table salt. Adding salt can cause blood pressure issues. Try to eat no more than 3g of salt from all sources daily.  [facility] will be in touch with you to obtain a sleep study at home. If you dont hear from them within the next few days, you can reach out to them directly at: [phone].  Return for recheck in 6-8 weeks.

## 2023-11-16 NOTE — Assessment & Plan Note (Signed)
Placed one month ago

## 2023-11-16 NOTE — Assessment & Plan Note (Signed)
Anxiety Well controlled on Fluoxetine 20mg  daily. No signs of depression or anxiety on screening. PHQ-9 score: 0 -Continue Fluoxetine 20mg  daily. -Order annual blood work to check platelets and sodium levels.

## 2023-11-16 NOTE — Assessment & Plan Note (Signed)
Discussed dietary modifications and increased cardiovascular activity. Pt informed on how to monitor HR, to get to goal of 200 bpm after cardiovascular activity Pt to complete diet journal to discuss upon follow up

## 2023-11-16 NOTE — Assessment & Plan Note (Signed)
Home sleep apnea study ordered through [facility]. Will review report for possible intervention pending results.

## 2023-11-16 NOTE — Assessment & Plan Note (Addendum)
Hypertension Controlled on Lisinopril 20mg  daily. Pt understands this is pregnancy cat X; is not trying to get pregnant and recently had nexplanon placed. Pt aware that if future pregnancy desired, this medication would need to be changed. -Continue Lisinopril 20mg  daily. -Consider potential for reducing or discontinuing medication with weight loss, treatment of potential sleep apnea, and reduced sodium intake.

## 2023-11-17 ENCOUNTER — Ambulatory Visit (INDEPENDENT_AMBULATORY_CARE_PROVIDER_SITE_OTHER): Payer: Medicaid Other | Admitting: Mental Health

## 2023-11-17 DIAGNOSIS — F4010 Social phobia, unspecified: Secondary | ICD-10-CM

## 2023-11-17 DIAGNOSIS — F429 Obsessive-compulsive disorder, unspecified: Secondary | ICD-10-CM

## 2023-11-17 DIAGNOSIS — F422 Mixed obsessional thoughts and acts: Secondary | ICD-10-CM | POA: Insufficient documentation

## 2023-11-17 HISTORY — DX: Obsessive-compulsive disorder, unspecified: F42.9

## 2023-11-17 HISTORY — DX: Social phobia, unspecified: F40.10

## 2023-11-17 NOTE — Progress Notes (Signed)
 Comprehensive Clinical Assessment (CCA) Note  11/17/2023 Kristi Lowe [id number]  Chief Complaint:  Chief Complaint  Patient presents with   Establish Care   Anxiety   Visit Diagnosis: OCD, social anxiety     CCA Screening, Triage and Referral (STR)  Patient Reported Information How did you hear about us ? Primary Care  Referral name: Tawni Masker  Referral phone number: No data recorded  Whom do you see for routine medical problems? Primary Care  What Is the Reason for Your Visit/Call Today? Mainly my whole life, had a crazy childhood with my parents. My mother was in the hospital, it was really bad and I was going though a lot, I thought she was going to die, thankfully she didn't. I was telling her (PCP) I had anxiety. I got on prozac  and she said that going on medication and a therapist would help a lot with me.  How Long Has This Been Causing You Problems? > than 6 months  What Do You Feel Would Help You the Most Today? Treatment for Depression or other mood problem   Have You Recently Been in Any Inpatient Treatment (Hospital/Detox/Crisis Center/28-Day Program)? No  Have You Ever Received Services From [employer] Before? No  Who Do You See at [facility]? No data recorded  Have You Recently Had Any Thoughts About Hurting Yourself? No  Are You Planning to Commit Suicide/Harm Yourself At This time? No   Have you Recently Had Thoughts About Hurting Someone [place]? No  Have You Used Any Alcohol or Drugs in the Past 24 Hours? No  What Did You Use and How Much? NA  Do You Currently Have a Therapist/Psychiatrist? No  Name of Therapist/Psychiatrist: NA  Have You Been Recently Discharged From Any Office Practice or Programs? No  Explanation of Discharge From Practice/Program: NA     CCA Screening Triage Referral Assessment Type of Contact: Face-to-Face  Collateral Involvement: None  Is CPS involved or ever been involved? Never  Is APS involved or  ever been involved? Never  Patient Determined To Be At Risk for Harm To Self or Others Based on Review of Patient Reported Information or Presenting Complaint? No  Method: No Plan  Availability of Means: No access or NA  Intent: Vague intent or NA  Notification Required: No need or identified person  Additional Information for Danger to Others Potential: No data recorded Additional Comments for Danger to Others Potential: NA  Are There Guns or Other Weapons in Your Home? No  Types of Guns/Weapons: NA  Who Could Verify You Are Able To Have These Secured: NA  Do You Have any Outstanding Charges, Pending Court Dates, Parole/Probation? NA  Contacted To Inform of Risk of Harm To Self or Others: No data recorded  Location of Assessment: [facility]  Does Patient Present under Involuntary Commitment? No  IVC Papers Initial File Date: No data recorded  [place] of Residence: [place]  Patient Currently Receiving the Following Services: Not Receiving Services   Determination of Need: Routine (7 days)   Options For Referral: Medication Management; Outpatient Therapy     CCA Biopsychosocial Intake/Chief Complaint:  Mainly my whole life, had a crazy childhood with my parents. My mother was in the hospital, it was really bad and I was going though a lot, I thought she was going to die, thankfully she didn't. I was telling her (PCP) I had anxiety. I got on prozac  and she said that going on medication and a therapist would help  a lot with me. Kristi Lowe is 17 year old single Caucasian female who presents for routine assessment to engage in outpatient therapy services with [facility] OP; referred by her primary care provider. Shares hx of being diagnosed with generalized anxiety approximately earlier this year. Notes for mother ot have not been doing well and was in hospital last spring in which she was experiencing increase anxiety around that time. Shares concerns for anxiety to  have initally presented during middle school noting feelings of fear to have presented in class. Reports behaviors of she would watch tv would have to watch the tv on a certain volume number and would have to restart  to get back to her desiganted number if she went past it; a behavior that has continued into present day. Shares to curretnly work at [employer] and shares can hold behaviors in which can become time consuming for her to get amout of tape just right. Shares thoughts of certain letters to be bad in which she will have to restart reading if she ends on that letter. When she is walking and steps on a certain bad step will have to redo it. Notes for this to be time consuming and shares she will have to go back to do things to avoid feelings of anxiety or avoid something bad from happening. Shares to currently take prozac  in which she is followed by her primary care provider. Notes some releif however symptoms persist.  Current Symptoms/Problems: anxiety, ritual behaviors   Patient Reported Schizophrenia/Schizoaffective Diagnosis in Past: No   Strengths: being a mom  Preferences: denies  Abilities:  pretty good in school, learning.   Type of Services Patient Feels are Needed: Need: communication, me talking to people, being more social.   Initial Clinical Notes/Concerns: Social anxiety, OCD   Mental Health Symptoms Depression:  None   Duration of Depressive symptoms: No data recorded  Mania:  None   Anxiety:   Worrying; Irritability; Difficulty concentrating; Restlessness; Tension (can increase in social situations, concerned for what others are thinking of her, feeling judged by others, can avoid going in social settings as a result.)   Psychosis:  None   Duration of Psychotic symptoms: No data recorded  Trauma:  None   Obsessions:  No data recorded  Compulsions:  Driven to perform behaviors/acts; Good insight; Intended to reduce stress or prevent another  outcome; Intrusive/time consuming; Disrupts with routine/functioning; Repeated behaviors/mental acts (shares to have started in middle school denies specfic significant stressor. Shares has a specific number on T.V in which the T.V has to be on, Z and X are bad letters with M and W are good letters shares is looking at something with those letters)   Inattention:  None   Hyperactivity/Impulsivity:  None   Oppositional/Defiant Behaviors:  None   Emotional Irregularity:  None   Other Mood/Personality Symptoms:  No data recorded   Mental Status Exam Appearance and self-care  Stature:  Average   Weight:  Overweight   Clothing:  Casual   Grooming:  Normal   Cosmetic use:  None   Posture/gait:  Normal   Motor activity:  Not Remarkable   Sensorium  Attention:  Normal   Concentration:  Normal   Orientation:  X5   Recall/memory:  Normal   Affect and Mood  Affect:  Anxious; Congruent   Mood:  Anxious   Relating  Eye contact:  Normal   Facial expression:  Anxious; Responsive   Attitude toward examiner:  Cooperative  Thought and Language  Speech flow: Clear and Coherent   Thought content:  Appropriate to Mood and Circumstances   Preoccupation:  None   Hallucinations:  None   Organization:  No data recorded  [employer] of Knowledge:  Good   Intelligence:  Average   Abstraction:  Functional   Judgement:  Fair; Good   Reality Testing:  Realistic   Insight:  Good   Decision Making:  Normal   Social Functioning  Social Maturity:  Responsible   Social Judgement:  Normal   Stress  Stressors:  Other (Comment) (None)   Coping Ability:  Exhausted   Skill Deficits:  None   Supports:  Family; Friends/Service system     Religion: Religion/Spirituality Are You A Religious Person?: No  Leisure/Recreation: Leisure / Recreation Do You Have Hobbies?: Yes Leisure and Hobbies: drawing, watching T.V, playing with her daughter (  1y.o)  Exercise/Diet: Exercise/Diet Do You Exercise?: Yes What Type of Exercise Do You Do?:  (youtube work outs) How Many Times a Week Do You Exercise?: 4-5 times a week Have You Gained or Lost A Significant Amount of Weight in the Past Six Months?: Yes-Gained Number of Pounds Gained: 30 Do You Follow a Special Diet?: No Do You Have Any Trouble Sleeping?: No   CCA Employment/Education Employment/Work Situation: Employment / Work Situation Employment Situation: Employed (full time) Where is Patient Currently Employed?: [employer] How Long has Patient Been Employed?: October of 2024 Are You Satisfied With Your Job?: Yes Do You Work More Than One Job?: No Work Stressors: denies Patient's Job has Been Impacted by Current Illness: No What is the Longest Time Patient has Held a Job?: 2 years Where was the Patient Employed at that Time?: [employer] Has Patient ever Been in the [employer]?: No  Education: Education Is Patient Currently Attending School?: Yes School Currently Attending: [employer] - looking to pursue EMS program Last Grade Completed: 12 Did You Graduate From [employer]?: Yes Did You Attend College?: Yes What Type of College Degree Do you Have?: currenlty attending Did You Attend Graduate School?: No What Was Your Major?: looking to get in EMS program Did You Have Any Special Interests In School?: - Did You Have An Individualized Education Program (IIEP): No Did You Have Any Difficulty At School?: No Patient's Education Has Been Impacted by Current Illness: Yes How Does Current Illness Impact Education?: shares would miss school due to anxieties and fears. Shares during in person classes would be scared to go to school. shares would not turn in assignments as a result   CCA Family/Childhood History Family and Relationship History: Family history Marital status: Long term relationship Long term relationship, how long?: 5 years What types of issues is patient dealing with  in the relationship?: denies Are you sexually active?: Yes What is your sexual orientation?: heterosexual Does patient have children?: Yes How many children?: 18 (32 years old) How is patient's relationship with their children?: Shares to have an amazing with her daughter  Childhood History:  Childhood History By whom was/is the patient raised?: Both parents Additional childhood history information: Shares to have been raised by her biological parents in [place]. Describes her childhood as It was something. Shares parents often fought. Notes at the age of 3 was removed from the home for a period of time and shares parents were in jail a lot. Description of patient's relationship with caregiver when they were a child: Mother: we had a good relationship. I could tell  her things. Father: good we were close when I was younger. Patient's description of current relationship with people who raised him/her: Mother: close relationship Father: shares disagreement a few months ago but shares were close prior How were you disciplined when you got in trouble as a child/adolescent?: - Does patient have siblings?: Yes Number of Siblings: 1 (x 1 younger sister - 50 years old) Description of patient's current relationship with siblings: Shares to be close to sister. Did patient suffer any verbal/emotional/physical/sexual abuse as a child?: No Did patient suffer from severe childhood neglect?: No Has patient ever been sexually abused/assaulted/raped as an adolescent or adult?: No Was the patient ever a victim of a crime or a disaster?: No Witnessed domestic violence?: No Has patient been affected by domestic violence as an adult?: No  Child/Adolescent Assessment:     CCA Substance Use Alcohol/Drug Use: Alcohol / Drug Use Prescriptions: See MAR History of alcohol / drug use?: No history of alcohol / drug abuse                         ASAM's:  Six Dimensions of  Multidimensional Assessment  Dimension 1:  Acute Intoxication and/or Withdrawal Potential:      Dimension 2:  Biomedical Conditions and Complications:      Dimension 3:  Emotional, Behavioral, or Cognitive Conditions and Complications:     Dimension 4:  Readiness to Change:     Dimension 5:  Relapse, Continued use, or Continued Problem Potential:     Dimension 6:  Recovery/Living Environment:     ASAM Severity Score:    ASAM Recommended Level of Treatment:     Substance use Disorder (SUD)    Recommendations for Services/Supports/Treatments: Recommendations for Services/Supports/Treatments Recommendations For Services/Supports/Treatments: Medication Management, Individual Therapy  DSM5 Diagnoses: Patient Active Problem List   Diagnosis Date Noted   Mixed obsessional thoughts and acts 11/17/2023   Social anxiety disorder 11/17/2023   Obsessive-compulsive disorder 11/17/2023   Encounter for weight loss counseling 11/16/2023   Daytime somnolence 11/16/2023   Snoring 11/16/2023   Insertion of Nexplanon  10/30/2023   Weight gain 04/16/2023   Hypertension 04/16/2023   Generalized anxiety disorder 11/03/2021   Frequent headaches 10/28/2021   Migraine without aura and without status migrainosus, not intractable 10/28/2021   Summary:   Arora is 17 year old single Caucasian female who presents for routine assessment to engage in outpatient therapy services with [facility] OP; referred by her primary care provider. Shares hx of being diagnosed with generalized anxiety approximately earlier this year. Notes for mother ot have not been doing well and was in hospital last spring in which she was experiencing increase anxiety around that time. Shares concerns for anxiety to have initally presented during middle school noting feelings of fear to have presented in class. Reports behaviors of she would watch tv would have to watch the tv on a certain volume number and would have to restart to get back  to her desiganted number if she went past it; a behavior that has continued into present day. Shares to curretnly work at [employer] and shares can hold behaviors in which can become time consuming for her to get amout of tape just right. Shares thoughts of certain letters to be bad in which she will have to restart reading if she ends on that letter. When she is walking and steps on a certain bad step will have to redo it. Notes for this to be  time consuming and shares she will have to go back to do things to avoid feelings of anxiety or avoid something bad from happening. Shares to currently take prozac  in which she is followed by her primary care provider. Notes some releif however symptoms persist.   Kristi Lowe presents for assessment alert and oriented x 4; mood and affect anxious. Speech clear and coherent at normal rate and tone. Thought process logical; goal oriented. Good eye-contact; noticeably anxious with fidgeting behaviors noted. Shares concerns for anxiety sxs dating back to middle school in which she would have difficulty getting up in class to turn in assignments with fear of others talking about her and judging her, noting those thoughts present in high degree of social situations. Shares for this to continue to occur with difficulty in public spaces in the community. Denies depressive sxs; denies hx of suicidal thoughts or actions; denies hx of self-harm behaviors. Ongoing anxiety with over thinking, over worry, tense, restlessness but denies hx of anxiety attacks occurring. Notes chief complaint of ritual behaviors in which she must complete in order to avoid increased anxiety and thoughts of something bad occurring. Noting having to do things a certain number of times with turning the faucet a certain amount of times or a light switch off and on a certain amount of times. Shares a certain number the volume must be on the T.V, if passes the number has to restart and do over again. Shares also  ritual behavior with walking and needing to go back and re-walk if she steps on something. Notes good insight and recognizing nothing bad will happen if she does not , however is something unfortunate occurs thoughts that she must not have performed a ritual in the exact needed way. Shares thoughts of 'good letters' like M and W with X and Z being 'bad letters' and if runs across that at the end of a sentence will have to re-read. Denies mania/mood swings; denies irritability. Denies psychotic sxs. Denies trauma events or trauma sxs; however reports for childhood to have held instability with parents being incarcerated frequently and period of being removed from the home at the age of 71.  Denies use of substances. Denies legal concerns. Shares to currently be engaged in work part time, noting for at times rituals to become time consuming at work with having to redo certain things.  Shares adequate supports with friends and family. Has x 14 year old daughter in which she shares good relationship with partner of x 5 years. Denies SI/HI/AVH. CSSRS, pain and nutrition completed.  Presentation consistent with OCD and social anxiety disorder.      11/17/2023    1:10 PM 11/16/2023   10:10 AM 06/24/2023    8:50 AM 05/06/2023    1:41 PM  GAD 7 : Generalized Anxiety Score  Nervous, Anxious, on Edge 0 1 2 2   Control/stop worrying 0 0 1 0  Worry too much - different things 1 1 1 2   Trouble relaxing 0 0 0 0  Restless 0 0 0 0  Easily annoyed or irritable 1 0 0 0  Afraid - awful might happen 0 0 0 0  Total GAD 7 Score 2 2 4 4   Anxiety Difficulty Somewhat difficult Not difficult at all Not difficult at all Not difficult at all       11/17/2023    1:10 PM 11/16/2023   10:10 AM 10/30/2023   10:28 AM 10/01/2023    1:33 PM 06/24/2023    8:50  AM  Depression screen PHQ 2/9  Decreased Interest 0 0 0 0 0  Down, Depressed, Hopeless 0 0 0 0 0  PHQ - 2 Score 0 0 0 0 0  Altered sleeping 0 0   0  Tired, decreased  energy 0 0   0  Change in appetite 0 0   0  Feeling bad or failure about yourself  0 0   0  Trouble concentrating 0 0   0  Moving slowly or fidgety/restless 0 0   0  Suicidal thoughts 0 0   0  PHQ-9 Score 0 0   0  Difficult doing work/chores Not difficult at all Not difficult at all   Not difficult at all   Txt plan will be completed at first session.   Patient Centered Plan: Patient is on the following Treatment Plan(s):  Anxiety   Referrals to Alternative Service(s): Referred to Alternative Service(s):   Place:   Date:   Time:    Referred to Alternative Service(s):   Place:   Date:   Time:    Referred to Alternative Service(s):   Place:   Date:   Time:    Referred to Alternative Service(s):   Place:   Date:   Time:      Collaboration of Care: Medication Management AEB referral for psych eval  Patient/Guardian was advised Release of Information must be obtained prior to any record release in order to collaborate their care with an outside provider. Patient/Guardian was advised if they have not already done so to contact the registration department to sign all necessary forms in order for us  to release information regarding their care.   Consent: Patient/Guardian gives verbal consent for treatment and assignment of benefits for services provided during this visit. Patient/Guardian expressed understanding and agreed to proceed.   Ty Bernice Savant, [facility]

## 2023-11-18 DIAGNOSIS — Z419 Encounter for procedure for purposes other than remedying health state, unspecified: Secondary | ICD-10-CM | POA: Diagnosis not present

## 2023-11-24 ENCOUNTER — Encounter (HOSPITAL_COMMUNITY): Payer: Self-pay

## 2023-11-24 ENCOUNTER — Other Ambulatory Visit: Payer: Self-pay

## 2023-11-24 ENCOUNTER — Encounter (HOSPITAL_COMMUNITY): Payer: Self-pay | Admitting: Emergency Medicine

## 2023-11-24 ENCOUNTER — Ambulatory Visit (HOSPITAL_COMMUNITY)
Admission: EM | Admit: 2023-11-24 | Discharge: 2023-11-24 | Disposition: A | Discharge status: Home / Self Care | Payer: Medicaid Other | Attending: Emergency Medicine | Admitting: Emergency Medicine

## 2023-11-24 DIAGNOSIS — B349 Viral infection, unspecified: Secondary | ICD-10-CM

## 2023-11-24 DIAGNOSIS — J029 Acute pharyngitis, unspecified: Secondary | ICD-10-CM

## 2023-11-24 LAB — POCT RAPID STREP A (OFFICE): Rapid Strep A Screen: NEGATIVE

## 2023-11-24 NOTE — ED Provider Notes (Signed)
 [facility]    CSN: [id number] Arrival date & time: 11/24/23  1108      History   Chief Complaint Chief Complaint  Patient presents with   URI    HPI Kristi Lowe is a 18 y.o. female.   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  Patient has taken DayQuil and NyQuil.  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  The history is provided by the patient. No language interpreter was used.    Past Medical History:  Diagnosis Date   Anxiety    Headache    History of gestational hypertension    Hypertension    Rubella non-immune status, antepartum     Patient Active Problem List   Diagnosis Date Noted   Nonspecific syndrome suggestive of viral illness 11/24/2023   Sore throat 11/24/2023   Mixed obsessional thoughts and acts 11/17/2023   Social anxiety disorder 11/17/2023   Obsessive-compulsive disorder 11/17/2023   Encounter for weight loss counseling 11/16/2023   Daytime somnolence 11/16/2023   Snoring 11/16/2023   Insertion of Nexplanon  10/30/2023   Weight gain 04/16/2023   Hypertension 04/16/2023   Generalized anxiety disorder 11/03/2021   Frequent headaches 10/28/2021   Migraine without aura and without status migrainosus, not intractable 10/28/2021    Past Surgical History:  Procedure Laterality Date   NO PAST SURGERIES      OB History     Gravida  1   Para  1   Term  1   Preterm      AB      Living  1      SAB      IAB      Ectopic      Multiple  0   Live Births  1            Home Medications    Prior to Admission medications   Medication Sig Start Date End Date Taking? Authorizing Provider  FLUoxetine  (PROZAC ) 20 MG tablet Take 1 tablet (20 mg total) by mouth daily. 11/16/23   Crain, Whitney L, PA  lisinopril  (PRINIVIL ) 20 MG tablet Take 1 tablet (20 mg total) by mouth daily.  11/16/23   Crain, Benton L, PA  Prenatal Vit-Fe Fumarate-FA (PRENATAL PLUS VITAMIN/MINERAL) 27-1 MG TABS Take 1 tablet by mouth daily in the afternoon. Patient not taking: Reported on 11/16/2023 10/01/23   Cleotilde Ronal RAMAN, MD    Family History Family History  Problem Relation Age of Onset   Anxiety disorder Mother    ADD / ADHD Mother    Anxiety disorder Maternal Grandmother    Migraines Maternal Grandmother    Aneurysm Maternal Grandmother    Hypertension Paternal Grandfather    Asthma Neg Hx    Cancer Neg Hx    Diabetes Neg Hx    Heart disease Neg Hx     Social History Social History   Tobacco Use   Smoking status: Never    Passive exposure: Never   Smokeless tobacco: Never  Vaping Use   Vaping status: Never Used  Substance Use Topics   Alcohol use: Never   Drug use: Never     Allergies   Patient has no known allergies.   Review of Systems Review of Systems  Constitutional:  Positive for chills. Negative for fever.  HENT:  Positive for sore throat.   Respiratory:  Positive  for cough.   Gastrointestinal:  Positive for diarrhea.  Musculoskeletal:  Positive for myalgias.  Neurological:  Positive for weakness and headaches.  All other systems reviewed and are negative.    Physical Exam Triage Vital Signs ED Triage Vitals  Encounter Vitals Group     BP 11/24/23 1209 122/79     Systolic BP Percentile --      Diastolic BP Percentile --      Pulse Rate 11/24/23 1209 (!) 104     Resp 11/24/23 1209 16     Temp 11/24/23 1209 98.8 F (37.1 C)     Temp Source 11/24/23 1209 Oral     SpO2 11/24/23 1209 98 %     Weight --      Height --      Head Circumference --      Peak Flow --      Pain Score 11/24/23 1207 7     Pain Loc --      Pain Education --      Exclude from Growth Chart --    No data found.  Updated Vital Signs BP 122/79 (BP Location: Left Arm)   Pulse (!) 104   Temp 98.8 F (37.1 C) (Oral)   Resp 16   LMP 10/02/2023 (Approximate)   SpO2  98%   Visual Acuity Right Eye Distance:   Left Eye Distance:   Bilateral Distance:    Right Eye Near:   Left Eye Near:    Bilateral Near:     Physical Exam Vitals and nursing note reviewed.  Constitutional:      General: She is not in acute distress.    Appearance: She is well-developed.  HENT:     Head: Normocephalic.     Right Ear: Tympanic membrane is retracted.     Left Ear: Tympanic membrane is retracted.     Nose: Congestion present.     Mouth/Throat:     Lips: Pink.     Mouth: Mucous membranes are moist.     Pharynx: Oropharynx is clear.  Eyes:     General: Lids are normal.     Conjunctiva/sclera: Conjunctivae normal.     Pupils: Pupils are equal, round, and reactive to light.  Neck:     Trachea: No tracheal deviation.  Cardiovascular:     Rate and Rhythm: Regular rhythm. Tachycardia present.     Pulses: Normal pulses.     Heart sounds: Normal heart sounds. No murmur heard. Pulmonary:     Effort: Pulmonary effort is normal.     Breath sounds: Normal breath sounds and air entry.     Comments: O2 sat 98% on room air, respirations 16 unlabored Abdominal:     General: Bowel sounds are normal.     Palpations: Abdomen is soft.     Tenderness: There is no abdominal tenderness.  Musculoskeletal:        General: Normal range of motion.     Cervical back: Normal range of motion.  Lymphadenopathy:     Cervical: No cervical adenopathy.  Skin:    General: Skin is warm and dry.     Findings: No rash.  Neurological:     General: No focal deficit present.     Mental Status: She is alert and oriented to person, place, and time.     GCS: GCS eye subscore is 4. GCS verbal subscore is 5. GCS motor subscore is 6.     Cranial Nerves: No cranial nerve deficit.  Sensory: No sensory deficit.  Psychiatric:        Speech: Speech normal.        Behavior: Behavior normal. Behavior is cooperative.      UC Treatments / Results  Labs (all labs ordered are listed, but only  abnormal results are displayed) Labs Reviewed  POCT RAPID STREP A (OFFICE)    EKG   Radiology No results found.  Procedures Procedures (including critical care time)  Medications Ordered in UC Medications - No data to display  Initial Impression / Assessment and Plan / UC Course  I have reviewed the triage vital signs and the nursing notes.  Pertinent labs & imaging results that were available during my care of the patient were reviewed by me and considered in my medical decision making (see chart for details).  Clinical Course as of 11/24/23 1837  Tue Nov 24, 2023  1254 Strep is negative, most likely this is viral in nature. [JD]    Clinical Course User Index [JD] Veeda Virgo, Rilla, NP   Discussed exam findings and plan of care with patient, strict go to ER precautions given.   Patient verbalized understanding to this provider.  Ddx: Viral illness, sore throat, allergies Final Clinical Impressions(s) / UC Diagnoses   Final diagnoses:  Nonspecific syndrome suggestive of viral illness  Sore throat     Discharge Instructions      Your strep test is negative , most likely you have a viral illness: no antibiotic is indicated at this time, May treat with OTC meds of choice. Make sure to drink plenty of fluids to stay hydrated(gatorade, water, popsicles,jello,etc), avoid caffeine products. Follow up with PCP. Return as needed.   ED Prescriptions   None    PDMP not reviewed this encounter.   Aminta Rilla, NP 11/24/23 1840

## 2023-11-24 NOTE — Discharge Instructions (Signed)
 Your strep test is negative , most likely you have a viral illness: no antibiotic is indicated at this time, May treat with OTC meds of choice. Make sure to drink plenty of fluids to stay hydrated(gatorade, water, popsicles,jello,etc), avoid caffeine products. Follow up with PCP. Return as needed.

## 2023-11-24 NOTE — ED Triage Notes (Signed)
 Symptoms started Sunday.  Symptoms include sore throat, cough, diarrhea, cold chills, sweaty episodes, body aches, headache and feeling weak  Patient has had dayquil and nyquil

## 2023-12-19 DIAGNOSIS — Z419 Encounter for procedure for purposes other than remedying health state, unspecified: Secondary | ICD-10-CM | POA: Diagnosis not present

## 2023-12-22 ENCOUNTER — Encounter: Payer: Self-pay | Admitting: Urgent Care

## 2023-12-24 ENCOUNTER — Other Ambulatory Visit: Payer: Self-pay | Admitting: Urgent Care

## 2023-12-24 DIAGNOSIS — R400 Somnolence: Secondary | ICD-10-CM

## 2023-12-24 DIAGNOSIS — R0683 Snoring: Secondary | ICD-10-CM

## 2023-12-28 ENCOUNTER — Encounter: Payer: Self-pay | Admitting: Urgent Care

## 2023-12-28 ENCOUNTER — Ambulatory Visit: Payer: Medicaid Other | Admitting: Urgent Care

## 2023-12-28 VITALS — BP 116/74 | HR 88 | Wt 237.0 lb

## 2023-12-28 DIAGNOSIS — R7303 Prediabetes: Secondary | ICD-10-CM | POA: Diagnosis not present

## 2023-12-28 DIAGNOSIS — R635 Abnormal weight gain: Secondary | ICD-10-CM | POA: Diagnosis not present

## 2023-12-28 DIAGNOSIS — F411 Generalized anxiety disorder: Secondary | ICD-10-CM | POA: Diagnosis not present

## 2023-12-28 MED ORDER — SEMAGLUTIDE-WEIGHT MANAGEMENT 0.5 MG/0.5ML ~~LOC~~ SOAJ: 0.5000 mg | SUBCUTANEOUS | 0 refills | Status: AC

## 2023-12-28 MED ORDER — SEMAGLUTIDE-WEIGHT MANAGEMENT 1.7 MG/0.75ML ~~LOC~~ SOAJ: 1.7000 mg | SUBCUTANEOUS | 0 refills | Status: DC

## 2023-12-28 MED ORDER — SEMAGLUTIDE-WEIGHT MANAGEMENT 0.25 MG/0.5ML ~~LOC~~ SOAJ: 0.2500 mg | SUBCUTANEOUS | 0 refills | Status: AC

## 2023-12-28 MED ORDER — SEMAGLUTIDE-WEIGHT MANAGEMENT 2.4 MG/0.75ML ~~LOC~~ SOAJ: 2.4000 mg | SUBCUTANEOUS | 0 refills | Status: DC

## 2023-12-28 MED ORDER — FLUOXETINE HCL 20 MG PO CAPS: 20.0000 mg | ORAL_CAPSULE | Freq: Every day | ORAL | 3 refills | Status: DC

## 2023-12-28 MED ORDER — SEMAGLUTIDE-WEIGHT MANAGEMENT 1 MG/0.5ML ~~LOC~~ SOAJ: 1.0000 mg | SUBCUTANEOUS | 0 refills | Status: AC

## 2023-12-28 NOTE — Progress Notes (Signed)
 Established Patient Office Visit  Subjective:  Patient ID: Kristi Lowe, female    DOB: 2006-01-17  Age: 18 y.o. MRN: [id number]  Chief Complaint  Patient presents with   Follow-up    6 week follow up and discuss weight    HPI Discussed the use of AI scribe software for clinical note transcription with the patient, who gave verbal consent to proceed.  History of Present Illness   Kristi Lowe is a 18 year old female who presents with concerns about weight management and prediabetes.  She is experiencing difficulty with weight loss despite dietary efforts and is concerned about potential weight gain. Her family history of obesity and prediabetes, particularly on her father's side, adds to her concern.  Her A1c has increased from 5.3 to 5.9 over the past year, placing her in the prediabetic range. She does not snack throughout the day. She only drinks water. She denies excessive intake of sweets..  Her current diet includes (example) eggs with milk, iced coffee with whole milk and a little creamer for breakfast, a sandwich with rotisserie chicken breast, Swiss cheese, and mayonnaise on white bread for lunch, and homemade tacos with ground beef, cilantro, onion, cheese, and homemade [place] rice for dinner. She does not snack between meals and drinks only water. She describes her diet as consisting mainly of water, plain rice, eggs, and sandwiches, with minimal snacking and no vegetables.  She is currently taking Prozac  20 mg and lisinopril  20 mg. There was an issue with Medicaid coverage for fluoxetine  tablets, which has been resolved by switching to capsules.  She works a 9 to 5 job and typically has limited time for meals during the day.      Patient Active Problem List   Diagnosis Date Noted   Nonspecific syndrome suggestive of viral illness 11/24/2023   Sore throat 11/24/2023   Mixed obsessional thoughts and acts 11/17/2023   Social anxiety disorder 11/17/2023    Obsessive-compulsive disorder 11/17/2023   Encounter for weight loss counseling 11/16/2023   Daytime somnolence 11/16/2023   Snoring 11/16/2023   Insertion of Nexplanon  10/30/2023   Weight gain 04/16/2023   Hypertension 04/16/2023   Generalized anxiety disorder 11/03/2021   Frequent headaches 10/28/2021   Migraine without aura and without status migrainosus, not intractable 10/28/2021   Past Medical History:  Diagnosis Date   Anxiety    Headache    History of gestational hypertension    Hypertension    Rubella non-immune status, antepartum    Past Surgical History:  Procedure Laterality Date   NO PAST SURGERIES     Social History   Tobacco Use   Smoking status: Never    Passive exposure: Never   Smokeless tobacco: Never  Vaping Use   Vaping status: Never Used  Substance Use Topics   Alcohol use: Never   Drug use: Never      ROS: as noted in HPI  Objective:     BP 116/74   Pulse 88   Wt 237 lb (107.5 kg)   SpO2 97%   BMI 38.25 kg/m  BP Readings from Last 3 Encounters:  12/28/23 116/74  11/24/23 122/79  11/16/23 111/71   Wt Readings from Last 3 Encounters:  12/28/23 237 lb (107.5 kg) (>99%, Z= 2.40)*  11/16/23 232 lb 12.8 oz (105.6 kg) (>99%, Z= 2.35)*  10/30/23 230 lb 9.6 oz (104.6 kg) (99%, Z= 2.33)*   * Growth percentiles are based on CDC (Girls, 2-20 Years) data.  Physical Exam Vitals and nursing note reviewed.  Constitutional:      General: She is not in acute distress.    Appearance: Normal appearance. She is obese. She is not ill-appearing, toxic-appearing or diaphoretic.  HENT:     Head: Normocephalic and atraumatic.  Eyes:     General: No scleral icterus.       Right eye: No discharge.        Left eye: No discharge.     Extraocular Movements: Extraocular movements intact.     Pupils: Pupils are equal, round, and reactive to light.  Cardiovascular:     Rate and Rhythm: Normal rate.  Pulmonary:     Effort: Pulmonary effort is  normal. No respiratory distress.  Skin:    General: Skin is warm and dry.     Coloration: Skin is not jaundiced.     Findings: No bruising, erythema or rash.  Neurological:     General: No focal deficit present.     Mental Status: She is alert and oriented to person, place, and time.     Gait: Gait normal.  Psychiatric:        Mood and Affect: Mood normal.        Behavior: Behavior normal.      No results found for any visits on 12/28/23.  Last CBC Lab Results  Component Value Date   WBC 9.1 11/16/2023   HGB 12.8 11/16/2023   HCT 40.1 11/16/2023   MCV 79.3 11/16/2023   MCH 26.2 08/21/2022   RDW 14.1 11/16/2023   PLT 365.0 11/16/2023   Last metabolic panel Lab Results  Component Value Date   GLUCOSE 84 11/16/2023   NA 137 11/16/2023   K 4.5 11/16/2023   CL 105 11/16/2023   CO2 23 11/16/2023   BUN 14 11/16/2023   CREATININE 0.63 11/16/2023   GFR 128.30 11/16/2023   CALCIUM 9.5 11/16/2023   PROT 7.4 11/16/2023   ALBUMIN 4.3 11/16/2023   LABGLOB 2.6 04/16/2023   AGRATIO 1.7 04/16/2023   BILITOT 0.3 11/16/2023   ALKPHOS 100 11/16/2023   AST 15 11/16/2023   ALT 19 11/16/2023   ANIONGAP 10 08/19/2022   Last lipids No results found for: "CHOL", "HDL", "LDLCALC", "LDLDIRECT", "TRIG", "CHOLHDL" Last hemoglobin A1c Lab Results  Component Value Date   HGBA1C 5.9 11/16/2023      The ASCVD Risk score (Arnett DK, et al., 2019) failed to calculate for the following reasons:   The 2019 ASCVD risk score is only valid for ages 41 to 63  Assessment & Plan:  Prediabetes -     Semaglutide -Weight Management; Inject 0.25 mg into the skin once a week for 28 days.  Dispense: 2 mL; Refill: 0 -     Semaglutide -Weight Management; Inject 0.5 mg into the skin once a week for 28 days.  Dispense: 2 mL; Refill: 0 -     Semaglutide -Weight Management; Inject 1 mg into the skin once a week for 28 days.  Dispense: 2 mL; Refill: 0 -     Semaglutide -Weight Management; Inject 1.7 mg into  the skin once a week for 28 days.  Dispense: 3 mL; Refill: 0 -     Semaglutide -Weight Management; Inject 2.4 mg into the skin once a week for 28 days.  Dispense: 3 mL; Refill: 0  Weight gain -     Semaglutide -Weight Management; Inject 0.25 mg into the skin once a week for 28 days.  Dispense: 2 mL; Refill: 0 -  Semaglutide -Weight Management; Inject 0.5 mg into the skin once a week for 28 days.  Dispense: 2 mL; Refill: 0 -     Semaglutide -Weight Management; Inject 1 mg into the skin once a week for 28 days.  Dispense: 2 mL; Refill: 0 -     Semaglutide -Weight Management; Inject 1.7 mg into the skin once a week for 28 days.  Dispense: 3 mL; Refill: 0 -     Semaglutide -Weight Management; Inject 2.4 mg into the skin once a week for 28 days.  Dispense: 3 mL; Refill: 0  Generalized anxiety disorder -     FLUoxetine  HCl; Take 1 capsule (20 mg total) by mouth daily.  Dispense: 90 capsule; Refill: 3   Assessment and Plan    Weight Management Difficulty losing weight despite dietary changes. Family history of overweight and prediabetes. Discussed the importance of incorporating vegetables and whole grains into diet. Prediabetic based on previous A1c of 5.9. -Start Wegovy  injection for weight loss, increasing dose monthly as tolerated. -Encourage incorporation of vegetables and whole grains into diet. -Follow up in 2 months to assess progress and tolerance of Wegovy .  Hypertension On Lisinopril  20mg  daily. Discussed potential for dose reduction with significant weight loss. -Continue Lisinopril  20mg  daily. -Monitor blood pressure during weight loss journey.  Medication Management Prozac  20mg  daily, previously prescribed as tablets which were not covered by insurance. -Change Prozac  prescription to capsules for insurance coverage. -Continue Prozac  20mg  daily.  Sleep Study Pending sleep study with [facility]. -Follow up with [facility] if no contact within 3 days.         Return in about 2 months (around 02/25/2024).   Mandy Second, PA

## 2023-12-28 NOTE — Patient Instructions (Addendum)
 Call: [facility] [phone] [address] [place],  [place]  [address]  Main: [phone]   Start Wegovy  - once weekly injections. Each month, increase dose.  Follow up in 2 months

## 2023-12-30 ENCOUNTER — Telehealth: Payer: Self-pay

## 2023-12-30 NOTE — Telephone Encounter (Signed)
Pharmacy Patient Advocate Encounter   Received notification from Fax that prior authorization for [facility] 0.25MG /0.5ML auto-injectors is required/requested.   Insurance verification completed.   The patient is insured through [facility] [place] .   Per test claim: PA required; PA submitted to above mentioned insurance via CoverMyMeds Key/confirmation #/EOC  [id number] Status is pending

## 2023-12-31 ENCOUNTER — Other Ambulatory Visit (HOSPITAL_COMMUNITY): Payer: Self-pay

## 2023-12-31 ENCOUNTER — Ambulatory Visit (HOSPITAL_COMMUNITY): Payer: Medicaid Other | Admitting: Student

## 2023-12-31 NOTE — Telephone Encounter (Signed)
Pharmacy Patient Advocate Encounter  Received notification from [facility] that Prior Authorization for  Wegovy 0.25MG /0.5ML auto-injectors  has been APPROVED from 12/28/23 to 06/25/24. Unable to obtain price due to refill too soon rejection, last fill date 12/30/23 next available fill date3/4/25   PA #/Case ID/Reference #: [id number]

## 2024-01-01 ENCOUNTER — Encounter (HOSPITAL_COMMUNITY): Payer: Self-pay | Admitting: Student

## 2024-01-01 ENCOUNTER — Other Ambulatory Visit (HOSPITAL_COMMUNITY): Payer: Self-pay

## 2024-01-01 ENCOUNTER — Ambulatory Visit (HOSPITAL_COMMUNITY): Payer: Medicaid Other | Admitting: Student

## 2024-01-01 DIAGNOSIS — F4010 Social phobia, unspecified: Secondary | ICD-10-CM

## 2024-01-01 DIAGNOSIS — F411 Generalized anxiety disorder: Secondary | ICD-10-CM

## 2024-01-01 DIAGNOSIS — F409 Phobic anxiety disorder, unspecified: Secondary | ICD-10-CM | POA: Diagnosis not present

## 2024-01-01 DIAGNOSIS — F429 Obsessive-compulsive disorder, unspecified: Secondary | ICD-10-CM

## 2024-01-01 DIAGNOSIS — Z8759 Personal history of other complications of pregnancy, childbirth and the puerperium: Secondary | ICD-10-CM | POA: Insufficient documentation

## 2024-01-01 MED ORDER — FLUOXETINE HCL 10 MG PO CAPS: 20.0000 mg | ORAL_CAPSULE | Freq: Every day | ORAL | 0 refills | Status: DC

## 2024-01-01 MED ORDER — FLUOXETINE HCL 10 MG PO CAPS
10.0000 mg | ORAL_CAPSULE | Freq: Every morning | ORAL | 0 refills | Status: DC
Start: 2024-01-01 — End: 2024-04-25

## 2024-01-01 NOTE — Progress Notes (Signed)
Psychiatric Initial Adult Assessment  Patient Identification: Kristi Lowe MRN: [id number] Date of Evaluation: 01/01/2024 Referral Source: Maretta Bees, [place]   Assessment:  Kristi Lowe is a 18 y.o. female with a documented history of OCD, GAD, social anxiety d/o, no suicide attempt or inpatient psych admission, HTN, recurrent headaches, who presents in person to [facility] for initial evaluation.  Risk Assessment: A suicide and violence risk assessment was performed as part of this evaluation. There patient is deemed to be at chronic elevated risk for self-harm/suicide given the following factors: N/A. These risk factors are mitigated by the following factors: lack of active SI/HI, no known access to weapons or firearms, no history of previous suicide attempts, no history of violence, motivation for treatment, utilization of positive coping skills, supportive family, sense of responsibility to family and social supports, minor children living at home, presence of a significant relationship, presence of an available support system, expresses purpose for living, current treatment compliance, effective problem solving skills, safe housing, and support system in agreement with treatment recommendations. The patient is deemed to be at chronic elevated risk for violence given the following factors: N/A. These risk factors are mitigated by the following factors: no known history of violence towards others, no known violence towards others in the last 6 months, no known history of threats of harm towards others, no known homicidal ideation in the last 6 months, no command hallucinations to harm others in the last 6 months, no active symptoms of psychosis, no active symptoms of mania, low impulsivity, intolerant attitude toward deviance, high intellectual functioning, positive social orientation, and connectedness to family. There is no acute risk for suicide or violence at this time.  The patient was educated about relevant modifiable risk factors including following recommendations for treatment of psychiatric illness and abstaining from substance abuse.  While future psychiatric events cannot be accurately predicted, the patient does not currently require  acute inpatient psychiatric care and does not currently meet [facility] involuntary commitment criteria.    Plan:  # OCD w good insight Past medication trials: prozac Status of problem: Chronic P/w intrusive thoughts of "getting back luck if" that are ego-dystonic, relieved with both behavioral and cognitive compulsions--counting, washing, checking, re-doing (taping, touching, and others). Still able to function, hold and takes only a few minutes.  However in the past there was function impairment, took hours to complete compulsions.  However she without intervention, she was able to change her thoughts to resist the compulsions more. Sought help because of severe exacerbation caused by mom's hospitalization, PCP (04/2023) started her on prozac to good effect thus far. Her symptoms are still significantly bothersome, however she is still able to work.  Increasing Prozac per below. Interventions: Therapist: Myrtie Neither, [facility] Labs: VitD, B12/folate-ordered 01/01/2024  INCREASED home prozac 20 mg to 30 mg qAM-she just dispensed 20 mg, provided 60 days of 10 mg  # GAD wo panic attacks  social anxiety d/o # Driving phobia Past medication trials:  Status of problem: Ongoing Unable to drive, relies on boyfriend to drive her everywhere. Interventions: SSRI per above  Health Maintenance PCP: Maretta Bees, [place]   Nexplanon-10/2023, expires 3 years later  # Snoring/frequent HA/daytime somnolence-sleep referral per PCP # HTN-lisinopril 20 mg # Bariatrics-semaglutide  Return to care in: Future Appointments  Date Time Provider Department Center  01/06/2024  8:00 AM Myrtie Neither, [facility] None   01/11/2024 11:15 AM Huston Foley, MD [facility] None  01/20/2024  9:00 AM [facility] None  02/05/2024  9:30 AM Princess Bruins, DO [facility] None  02/25/2024  8:00 AM Crain, Jodelle Gross, PA [facility]   Patient was given contact information for behavioral health clinic and was instructed to call 911 for emergencies.   Patient and plan of care will be discussed with the Attending MD, who agrees with the above statement and plan.   Subjective:  Chief Complaint:  Chief Complaint  Patient presents with   Establish Care   Anxiety   Medication Management   History of Present Illness:   I have reviewed the PDMP during this encounter.  Unaccompanied.  10/2023 labs CMP, CBC, A1c, TSH-unremarkable  Rx:  lisinopril 20 Prozac 20 - started summer 2024, from PCP Semaglutide  Here for anxiety, dx by her therapist. She has been having these sxs since 18yo.  Spontaneous onset, no stressors at the time.  Started gradually.  Her anxiety and OCD symptoms was exacerbated when her mom was in the hospital, which is when she sought help from PCP, who started her on Prozac, which she has found to be helpful. Denied side effects.  This is her first psychotropic, was prescribed for paranoia in the past for situational anxiety, however she only tried it a few times, unsure if it worked, she had to stop because she got pregnant.  Today denied active and passive SI/HI, AVH, paranoia.  Mood:  Depression: Denied, feels happy, enjoys life Anxiety: yes -scared of driving and can't drive boyfriend drives her everywhere  Sleep: "ok", has a 15 mo baby, but she sleeps pretty ok Energy: No change in energy, although she does have daytime somnolence.  Her PCP is already put in for a sleep study referral. Activity change: Denied Concentration: Same, no change, she feels like this is fine Appetite: No change, however she will be starting semaglutide soon Hopelessness, guilt: Denied Active SI:  Denied Passive SI: Denied  Dx OCD: Onset about 18yo, and was severe.  She did not go to see anyone.  -When symptoms first occur, compulsion to take hours to complete and  was distressing.  "Can't use/do that, you will get bad luck" intrusive thought  -Steps on cracks in road a certain way -Lights switches needs to be turned of and off a certain number of time -TV vol on certain number, evens only -tape and re-tape boxes at work  -not that time consuming, still able to work -was worst in the past, but she has talked herself into thinking, "its ok, i'll just have bad luck" to resist the compulsion -can resist compulsion, but she feels "icky", not to the point of impending doom anymore, did in the past.  Avoidance: driving, social events, school/college   Specific phobias: Denied Panic attacks: Denied Able to go to work, and to [employer] or gas stations for daily chores.  However when she does do grocery store, she feels that she has to go with her boyfriend.  She is able to go to work alone problem.  -much improved since starting prozac  Hypo-/mania:  Persistent excessive energy or activity (>4-7d): Denied Persistent expansive or irritable mood: Denied Grandiosity: Denied Decreased need of sleep (<3hr/night): Denied Risky behaviors: Denied  Psychosis:  AVH: Denied Paranoia: Denied  First rank sxs: Denied   Trauma:  H/o sexual abuse: Denied H/o physical abuse: Denied  EtOH: Denied Nicotine: Denied Cannabis: Denied Other substances: Denied  Patient amenable to med changes per above after discussing the risks (FDA warning  of SI), benefits, and side effects. Otherwise patient had no other questions or concerns and was amenable to plan per above.  Safety:  Active SI: denied Passive SI: denied Psychosis: denied  Patient is aware of [facility], 988 and 911 as well.   Review of Systems  Constitutional:  Negative for malaise/fatigue.       Does get sleepy  HENT:   Negative for congestion.   Respiratory:  Negative for shortness of breath.   Cardiovascular:  Negative for chest pain.  Gastrointestinal:  Negative for abdominal pain, constipation, diarrhea, nausea and vomiting.  Neurological:  Positive for headaches. Negative for dizziness, tremors and seizures.      Past Psychiatric History:  Diagnoses: GAD, SocAD, OCD Medication trials:  Prozac (effective thus far, 04/2023-current) Propranolol for anxiety and headache (2023, unsure if effective because got pregnant and stopped) Suicide attempts: Denied SIB: Denied Hospitalizations: Denied Previous psychiatrist/therapist: Denied Hx of violence towards others: Denied Current access to guns: Denied Hx of trauma/abuse: Denied  Substance Use History: EtOH:  reports no history of alcohol use. Nicotine:  reports that she has never smoked. She has never been exposed to tobacco smoke. She has never used smokeless tobacco. Marijuana: Denied IV drug use: Denied Stimulants: Denied Opiates: Denied Sedative/hypnotics: Denied Hallucinogens: Denied  Past Medical History: Dx:  has a past medical history of Anxiety, Headache, History of gestational hypertension, Hypertension, and Rubella non-immune status, antepartum.  Allergies: Patient has no known allergies.  Head trauma: Denied Seizures: Denied  Family Psychiatric History:  Suicide: Denied Homicide: Denied Psych hospitalization: Denied BiPD: Denied SCZ/SCzA: Denied Substance use: Denied Mom with anxiety unspecified and ADHD per chart review  Social History:  Housing: In [place] with boyfriend and daughter Income: employed  Family: Entire family is in [place] Education: college [employer] - couple of classes, goal for EMS program Marital Status: single Children: daughter (08/2023) Support: Family, partner Legal: Denied Developmental: Denied  Substance Abuse History in the last 12 months:  No.  Past Medical History:  Past Medical  History:  Diagnosis Date   Anxiety    Headache    History of gestational hypertension    Hypertension    Rubella non-immune status, antepartum     Past Surgical History:  Procedure Laterality Date   NO PAST SURGERIES     Family History:  Family History  Problem Relation Age of Onset   Anxiety disorder Mother    ADD / ADHD Mother    Anxiety disorder Maternal Grandmother    Migraines Maternal Grandmother    Aneurysm Maternal Grandmother    Hypertension Paternal Grandfather    Asthma Neg Hx    Cancer Neg Hx    Diabetes Neg Hx    Heart disease Neg Hx    Suicidality Neg Hx        or homicide   Schizophrenia Neg Hx    Bipolar disorder Neg Hx    Drug abuse Neg Hx    Social History:   Social History   Socioeconomic History   Marital status: Single    Spouse name: Not on file   Number of children: Not on file   Years of education: Not on file   Highest education level: Some college, no degree  Occupational History    Comment: [employer]  Tobacco Use   Smoking status: Never    Passive exposure: Never   Smokeless tobacco: Never  Vaping Use   Vaping status: Never Used  Substance and Sexual Activity   Alcohol use: Never  Drug use: Never   Sexual activity: Yes    Birth control/protection: Implant    Comment: POPs  Other Topics Concern   Not on file  Social History Narrative   Mae lives with mom, dad, and sister.    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 She does not the best, but not the worst in school.    She would like to go to a community college and the transfer to a university for Public relations account executive.       At 17yo (2025), lives alone with boyfriend of 6 years + daughter (born 2024)   Currently taking college courses online, with a goal of EMS program   Working as well at a print shop   Family is nearby and supportive   Social Drivers of Health   Financial Resource Strain: Low Risk  (11/15/2023)   Overall Financial Resource Strain (CARDIA)     Difficulty of Paying Living Expenses: Not hard at all  Food Insecurity: No Food Insecurity (11/15/2023)   Hunger Vital Sign    Worried About Running Out of Food in the Last Year: Never true    Ran Out of Food in the Last Year: Never true  Transportation Needs: No Transportation Needs (11/15/2023)   PRAPARE - Administrator, Civil Service (Medical): No    Lack of Transportation (Non-Medical): No  Physical Activity: Sufficiently Active (11/15/2023)   Exercise Vital Sign    Days of Exercise per Week: 7 days    Minutes of Exercise per Session: 30 min  Stress: No Stress Concern Present (11/15/2023)   [employer] of Occupational Health - Occupational Stress Questionnaire    Feeling of Stress : Not at all  Social Connections: Moderately Isolated (11/15/2023)   Social Connection and Isolation Panel [NHANES]    Frequency of Communication with Friends and Family: More than three times a week    Frequency of Social Gatherings with Friends and Family: Twice a week    Attends Religious Services: 1 to 4 times per year    Active Member of [employer] or Organizations: No    Attends Engineer, structural: Not on file    Marital Status: Never married   Additional Social History: updated  Allergies:  No Known Allergies  Current Medications: Current Outpatient Medications  Medication Sig Dispense Refill   FLUoxetine (PROZAC) 10 MG capsule Take 1 capsule (10 mg total) by mouth every morning. At the same time as the Prozac 20 mg capsule, for a total of 30 mg every morning 60 capsule 0   lisinopril (PRINIVIL) 20 MG tablet Take 1 tablet (20 mg total) by mouth daily. 30 tablet 2   Semaglutide-Weight Management 0.25 MG/0.5ML SOAJ Inject 0.25 mg into the skin once a week for 28 days. 2 mL 0   [START ON 01/26/2024] Semaglutide-Weight Management 0.5 MG/0.5ML SOAJ Inject 0.5 mg into the skin once a week for 28 days. 2 mL 0   [START ON 02/24/2024] Semaglutide-Weight Management 1 MG/0.5ML SOAJ  Inject 1 mg into the skin once a week for 28 days. 2 mL 0   [START ON 03/24/2024] Semaglutide-Weight Management 1.7 MG/0.75ML SOAJ Inject 1.7 mg into the skin once a week for 28 days. 3 mL 0   [START ON 04/22/2024] Semaglutide-Weight Management 2.4 MG/0.75ML SOAJ Inject 2.4 mg into the skin once a week for 28 days. 3 mL 0   No current facility-administered medications for this visit.   Objective:  Psychiatric Specialty Exam: There  is no height or weight on file to calculate BMI. There were no vitals taken for this visit.  General Appearance: Casual, faily groomed  Eye Contact:  Good    Speech:  Clear, coherent, mildly fast rate, not pressured, interruptible  Volume:  Normal   Mood:  See above  Affect:  Appropriate, congruent, full range  Thought Content: Logical, anxious rumination, intrusive thoughts about bad luck, see above for details  Suicidal Thoughts: See above   Thought Process:  Coherent, goal-directed, linear   Orientation:  A&Ox4   Memory:  Immediate good  Judgment: Good  Insight: Good  Concentration:  Attention and concentration good   Recall:  Good  Fund of Knowledge: Good  Language: Good, fluent  Psychomotor Activity: see above  Akathisia:  See above  AIMS (if indicated): See above if indicated  Assets:  Communication Skills Desire for Improvement Financial Resources/Insurance Housing Intimacy Resilience Social Support Talents/Skills Transportation Vocational/Educational  ADL's:  Intact  Cognition: WNL  Sleep:  See above    Physical Exam Vitals and nursing note reviewed.  Constitutional:      General: She is awake. She is not in acute distress.    Appearance: Normal appearance. She is not ill-appearing, toxic-appearing or diaphoretic.  HENT:     Head: Normocephalic and atraumatic.  Eyes:     Conjunctiva/sclera: Conjunctivae normal.  Pulmonary:     Effort: Pulmonary effort is normal. No respiratory distress.  Neurological:     General: No focal  deficit present.     Mental Status: She is alert and oriented to person, place, and time.     Gait: Gait normal.     Metabolic Disorder Labs: Lab Results  Component Value Date   HGBA1C 5.9 11/16/2023   No results found for: "PROLACTIN" No results found for: "CHOL", "TRIG", "HDL", "CHOLHDL", "VLDL", "LDLCALC" Lab Results  Component Value Date   TSH 1.03 11/16/2023    Therapeutic Level Labs: No results found for: "LITHIUM" No results found for: "CBMZ" No results found for: "VALPROATE"  Screenings:  GAD-7    Flowsheet Row Office Visit from 12/28/2023 in [facility] [place] [facility] from 11/17/2023 in [facility] Office Visit from 11/16/2023 in [facility] [place] Office Visit from 06/24/2023 in [facility] Video Visit from 05/06/2023 in [facility] at [facility]  Total GAD-7 Score 2 2 2 4 4       PHQ2-9    Flowsheet Row Office Visit from 12/28/2023 in [facility] [place] Counselor from 11/17/2023 in [facility] Office Visit from 11/16/2023 in [facility] [place] Procedure visit from 10/30/2023 in [facility] Office Visit from 10/01/2023 in [facility] for [facility] at [facility]  PHQ-2 Total Score 0 0 0 0 0  PHQ-9 Total Score 0 0 0 -- --      Flowsheet Row ED from 11/24/2023 in [facility] from 11/17/2023 in [facility] Admission (Discharged) from 08/19/2022 in [place] 4S Mother Baby Unit  C-SSRS RISK CATEGORY No Risk No Risk No Risk       Collaboration of Care: see above  Princess Bruins, DO Psych Resident, PGY-3 01/01/2024, 10:04 AM

## 2024-01-04 ENCOUNTER — Other Ambulatory Visit (HOSPITAL_COMMUNITY): Payer: Self-pay

## 2024-01-06 ENCOUNTER — Ambulatory Visit (HOSPITAL_COMMUNITY): Payer: Medicaid Other | Admitting: Mental Health

## 2024-01-11 ENCOUNTER — Ambulatory Visit (INDEPENDENT_AMBULATORY_CARE_PROVIDER_SITE_OTHER): Payer: Medicaid Other | Admitting: Neurology

## 2024-01-11 ENCOUNTER — Encounter: Payer: Self-pay | Admitting: Neurology

## 2024-01-11 VITALS — BP 121/76 | HR 88 | Ht 65.0 in | Wt 235.0 lb

## 2024-01-11 DIAGNOSIS — R519 Headache, unspecified: Secondary | ICD-10-CM

## 2024-01-11 DIAGNOSIS — E669 Obesity, unspecified: Secondary | ICD-10-CM

## 2024-01-11 DIAGNOSIS — R351 Nocturia: Secondary | ICD-10-CM | POA: Diagnosis not present

## 2024-01-11 DIAGNOSIS — Z9189 Other specified personal risk factors, not elsewhere classified: Secondary | ICD-10-CM

## 2024-01-11 DIAGNOSIS — G4719 Other hypersomnia: Secondary | ICD-10-CM | POA: Diagnosis not present

## 2024-01-11 DIAGNOSIS — R0683 Snoring: Secondary | ICD-10-CM

## 2024-01-11 NOTE — Patient Instructions (Signed)

## 2024-01-11 NOTE — Progress Notes (Signed)
 Subjective:    Patient ID: Kristi Lowe is a 18 y.o. female.  HPI    Kristi Foley, MD, PhD [facility] [address] [address] [place], [place] [address]  Dear Kristi Lowe,  I saw your patient, Kristi Lowe, upon your kind request in my sleep clinic today for initial consultation of her sleep disorder, in particular, concern for underlying obstructive sleep apnea.  The patient is unaccompanied today.  As you know, Kristi Lowe is a 18 year old female with an underlying medical history of hypertension, prediabetes, OCD, migraine headaches, social anxiety, general anxiety disorder and obesity, who reports snoring and excessive daytime somnolence is frequent morning headaches.  Her Epworth sleepiness score is 11 out of 24, fatigue severity score is 45 out of 63.  I reviewed your office note from 11/16/2023 as well as 12/28/2023.  She is not aware of any family history of sleep apnea.  She lives with her boyfriend and her 109-year-old daughter.  Her daughter sleeps in her own room.  They do have a TV in their bedroom but it is typically not on at night.  Bedtime is generally between 9 and 11 and rise time between 7 and 8.  She has nocturia about once or twice per average night and endorses frequent morning headaches, sometimes dull and achy, sometimes more pounding and severe.  She has a history of ocular migraines.  She has gained weight in the realm of 30 pounds within the past year.  She works in a Market researcher.  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   Rubella non-immune status, antepartum    Social anxiety disorder 11/17/2023    Her Past Surgical History Is Significant For: Past Surgical History:   Procedure Laterality Date   NO PAST SURGERIES      Her Family History Is Significant For: Family History  Problem Relation Age of Onset   Anxiety disorder Mother    ADD / ADHD Mother    Anxiety disorder Maternal Grandmother    Migraines Maternal Grandmother    Aneurysm Maternal Grandmother    Hypertension Paternal Grandfather    Asthma Neg Hx    Cancer Neg Hx    Diabetes Neg Hx    Heart disease Neg Hx    Suicidality Neg Hx        or homicide   Schizophrenia Neg Hx    Bipolar disorder Neg Hx    Drug abuse Neg Hx     Her Social History Is Significant For: Social History   Socioeconomic History   Marital status: Single    Spouse name: Not on file   Number of children: Not on file   Years of education: Not on file   Highest education level: Some college, no degree  Occupational History    Comment: [employer]  Tobacco Use   Smoking status: Never    Passive exposure: Never   Smokeless tobacco: Never  Vaping Use   Vaping status: Never Used  Substance and Sexual Activity   Alcohol use: Never   Drug use: Never   Sexual activity: Yes    Birth control/protection: Implant    Comment: POPs  Other Topics Concern   Not on file  Social History Narrative   Kristi Lowe  lives with mom, dad, and sister.    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 She does not the best, but not the worst in school.    She would like to go to a community college and the transfer to a university for Public relations account executive.       At 17yo (2025), lives alone with boyfriend of 6 years + daughter (born 2024)   Currently taking college courses online, with a goal of EMS program   Working as well at a print shop   Family is nearby and supportive   Social Drivers of Health   Financial Resource Strain: Low Risk  (11/15/2023)   Overall Financial Resource Strain (CARDIA)    Difficulty of Paying Living Expenses: Not hard at all  Food Insecurity: No Food Insecurity (11/15/2023)   Hunger Vital Sign     Worried About Running Out of Food in the Last Year: Never true    Ran Out of Food in the Last Year: Never true  Transportation Needs: No Transportation Needs (11/15/2023)   PRAPARE - Administrator, Civil Service (Medical): No    Lack of Transportation (Non-Medical): No  Physical Activity: Sufficiently Active (11/15/2023)   Exercise Vital Sign    Days of Exercise per Week: 7 days    Minutes of Exercise per Session: 30 min  Stress: No Stress Concern Present (11/15/2023)   Kristi Lowe [employer] Occupational [employer] - Occupational Stress Questionnaire    Feeling of Stress : Not at all  Social Connections: Moderately Isolated (11/15/2023)   Social Connection and Isolation Panel [NHANES]    Frequency of Communication with Friends and Family: More than three times a week    Frequency of Social Gatherings with Friends and Family: Twice a week    Attends Religious Services: 1 to 4 times per year    Active Member of [employer] or Organizations: No    Attends Engineer, structural: Not on file    Marital Status: Never married    Her Allergies Are:  No Known Allergies:   Her Current Medications Are:  Outpatient Encounter Medications as of 01/11/2024  Medication Sig   FLUoxetine (PROZAC) 10 MG capsule Take 1 capsule (10 mg total) by mouth every morning. At the same time as the Prozac 20 mg capsule, for a total of 30 mg every morning   lisinopril (PRINIVIL) 20 MG tablet Take 1 tablet (20 mg total) by mouth daily.   Semaglutide-Weight Management 0.25 MG/0.5ML SOAJ Inject 0.25 mg into the skin once a week for 28 days.   [START ON 01/26/2024] Semaglutide-Weight Management 0.5 MG/0.5ML SOAJ Inject 0.5 mg into the skin once a week for 28 days. (Patient not taking: Reported on 01/11/2024)   [START ON 02/24/2024] Semaglutide-Weight Management 1 MG/0.5ML SOAJ Inject 1 mg into the skin once a week for 28 days. (Patient not taking: Reported on 01/11/2024)   [START ON 03/24/2024] Semaglutide-Weight  Management 1.7 MG/0.75ML SOAJ Inject 1.7 mg into the skin once a week for 28 days. (Patient not taking: Reported on 01/11/2024)   [START ON 04/22/2024] Semaglutide-Weight Management 2.4 MG/0.75ML SOAJ Inject 2.4 mg into the skin once a week for 28 days. (Patient not taking: Reported on 01/11/2024)   No facility-administered encounter medications on file as of 01/11/2024.  :   Review of Systems:  Out of a complete 14 point review of systems, all are reviewed and negative with the exception of these symptoms as listed below:  Review of Systems  Neurological:        Pt in Rm #9 and alone. Patient states been snores and feels uncomfortable at night. Patient states she wakes up tried and hard to fall asleep at night. ESS-11 & FSS-45    Objective:  Neurological Exam  Physical Exam Physical Examination:   Vitals:   01/11/24 1101  BP: 121/76  Pulse: 88    General Examination: The patient is a very pleasant 18 y.o. female in no acute distress. She appears well-developed and well-nourished and well groomed.   HEENT: Normocephalic, atraumatic, pupils are equal, round and reactive to light, extraocular tracking is good without limitation to gaze excursion or nystagmus noted. Hearing is grossly intact. Face is symmetric with normal facial animation. Speech is clear with no dysarthria noted. There is no hypophonia. There is no lip, neck/head, jaw or voice tremor. Neck is supple with full range of passive and active motion. There are no carotid bruits on auscultation. Oropharynx exam reveals: No significant mouth dryness, good dental hygiene, mild airway crowding secondary to tonsillar size of about 3+ bilaterally.  Mallampati class I.  Minimal to mild overbite noted.  Tongue protrudes centrally and palate elevates symmetrically.  Neck circumference 16-5/8 inches.   Chest: Clear to auscultation without wheezing, rhonchi or crackles noted.  Heart: S1+S2+0, regular and normal without murmurs, rubs or  gallops noted.   Abdomen: Soft, non-tender and non-distended.  Extremities: There is no pitting edema in the distal lower extremities bilaterally.   Skin: Warm and dry without trophic changes noted.   Musculoskeletal: exam reveals no obvious joint deformities.   Neurologically:  Mental status: The patient is awake, alert and oriented in all 4 spheres. Her immediate and remote memory, attention, language skills and fund of knowledge are appropriate. There is no evidence of aphasia, agnosia, apraxia or anomia. Speech is clear with normal prosody and enunciation. Thought process is linear. Mood is normal and affect is normal.  Cranial nerves II - XII are as described above under HEENT exam.  Motor exam: Normal bulk, strength and tone is noted. There is no obvious action or resting tremor.  Fine motor skills and coordination: grossly intact.  Cerebellar testing: No dysmetria or intention tremor. There is no truncal or gait ataxia.  Sensory exam: intact to light touch in the upper and lower extremities.  Gait, station and balance: She stands easily. No veering to one side is noted. No leaning to one side is noted. Posture is age-appropriate and stance is narrow based. Gait shows normal stride length and normal pace. No problems turning are noted.   Assessment and Plan:  In summary, Madisan Bice is a very pleasant 18 y.o.-year old female with an underlying medical history of hypertension, prediabetes, OCD, migraine headaches, social anxiety, general anxiety disorder and obesity, whose history and physical exam are concerning for sleep disordered breathing,  particularly obstructive sleep apnea (OSA). A laboratory attended sleep study is typically considered "gold standard" for evaluation of sleep disordered breathing.   I had a long chat with the patient about my findings and the diagnosis of sleep apnea, particularly OSA, its prognosis and treatment options. We talked about medical/conservative  treatments, surgical interventions and non-pharmacological approaches for symptom control. I explained, in particular, the risks and ramifications of untreated moderate to severe OSA, especially with respect to developing cardiovascular disease down the road, including congestive heart failure (CHF), difficult to treat hypertension, cardiac arrhythmias (particularly A-fib), neurovascular complications including TIA, stroke and dementia. Even type 2 diabetes has,  in part, been linked to untreated OSA. Symptoms of untreated OSA may include (but may not be limited to) daytime sleepiness, nocturia (i.e. frequent nighttime urination), memory problems, mood irritability and suboptimally controlled or worsening mood disorder such as depression and/or anxiety, lack of energy, lack of motivation, physical discomfort, as well as recurrent headaches, especially morning or nocturnal headaches. We talked about the importance of maintaining a healthy lifestyle and striving for healthy weight.  In addition, we talked about the importance of striving for and maintaining good sleep hygiene. I recommended a sleep study at this time. I outlined the differences between a laboratory attended sleep study which is considered more comprehensive and accurate over the option of a home sleep test (HST); the latter may lead to underestimation of sleep disordered breathing in some instances and does not help with diagnosing upper airway resistance syndrome and is not accurate enough to diagnose primary central sleep apnea typically. I outlined possible surgical and non-surgical treatment options of OSA, including the use of a positive airway pressure (PAP) device (i.e. CPAP, AutoPAP/APAP or BiPAP in certain circumstances), a custom-made dental device (aka oral appliance, which would require a referral to a specialist dentist or orthodontist typically, and is generally speaking not considered for patients with full dentures or edentulous  state), upper airway surgical options, such as traditional UPPP (which is not considered a first-line treatment) or the Inspire device (hypoglossal nerve stimulator, which would involve a referral for consultation with an ENT surgeon, after careful selection, following inclusion criteria - also not first-line treatment). I explained the PAP treatment option to the patient in detail, as this is generally considered first-line treatment.  The patient indicated that she would be willing to try PAP therapy, if the need arises. I explained the importance of being compliant with PAP treatment, not only for insurance purposes but primarily to improve patient's symptoms symptoms, and for the patient's long term health benefit, including to reduce Her cardiovascular risks longer-term.    We will pick up our discussion about the next steps and treatment options after testing.  We will keep her posted as to the test results by phone call and/or MyChart messaging where possible.  We will plan to follow-up in sleep clinic accordingly as well.  I answered all her questions today and the patient was in agreement.   I encouraged her to call with any interim questions, concerns, problems or updates or email [place] through MyChart.  Generally speaking, sleep test authorizations may take up to 2 weeks, sometimes less, sometimes longer, the patient is encouraged to get in touch with [place] if they do not hear back from the sleep lab staff directly within the next 2 weeks.  Thank you very much for allowing me to participate in the care of this nice patient. If I can be of any further assistance to you please do not hesitate to call me at [phone].  Sincerely,   Kristi Foley, MD, PhD

## 2024-01-14 ENCOUNTER — Telehealth: Payer: Self-pay | Admitting: Neurology

## 2024-01-14 NOTE — Telephone Encounter (Signed)
 [facility] pending

## 2024-01-16 DIAGNOSIS — Z419 Encounter for procedure for purposes other than remedying health state, unspecified: Secondary | ICD-10-CM | POA: Diagnosis not present

## 2024-01-18 NOTE — Telephone Encounter (Signed)
 [facility] Berkley Harvey: [id number] (exp. 01/14/24 to 04/13/24)

## 2024-01-19 ENCOUNTER — Other Ambulatory Visit (HOSPITAL_COMMUNITY): Payer: Self-pay

## 2024-01-19 NOTE — Telephone Encounter (Signed)
 [facility] Abbie Sons: [id number] (exp. 01/14/24 to 04/13/24)   Patient is scheduled at [facility] for 02/16/24 at 9 pm.  Mailed packet to the patient as well.

## 2024-01-19 NOTE — Telephone Encounter (Signed)
 LVM for pt to call back to schedule.

## 2024-01-20 ENCOUNTER — Other Ambulatory Visit (HOSPITAL_COMMUNITY): Payer: Medicaid Other

## 2024-01-26 ENCOUNTER — Other Ambulatory Visit: Payer: Self-pay

## 2024-01-26 DIAGNOSIS — R7303 Prediabetes: Secondary | ICD-10-CM

## 2024-01-26 DIAGNOSIS — R635 Abnormal weight gain: Secondary | ICD-10-CM

## 2024-01-26 MED ORDER — SEMAGLUTIDE-WEIGHT MANAGEMENT 1.7 MG/0.75ML ~~LOC~~ SOAJ: 1.7000 mg | SUBCUTANEOUS | 0 refills | Status: AC

## 2024-02-05 ENCOUNTER — Encounter (HOSPITAL_COMMUNITY): Payer: Medicaid Other | Admitting: Student

## 2024-02-16 ENCOUNTER — Other Ambulatory Visit: Payer: Self-pay

## 2024-02-16 ENCOUNTER — Ambulatory Visit (INDEPENDENT_AMBULATORY_CARE_PROVIDER_SITE_OTHER): Admitting: Neurology

## 2024-02-16 DIAGNOSIS — I10 Essential (primary) hypertension: Secondary | ICD-10-CM

## 2024-02-16 DIAGNOSIS — G4719 Other hypersomnia: Secondary | ICD-10-CM

## 2024-02-16 DIAGNOSIS — G4720 Circadian rhythm sleep disorder, unspecified type: Secondary | ICD-10-CM

## 2024-02-16 DIAGNOSIS — R351 Nocturia: Secondary | ICD-10-CM

## 2024-02-16 DIAGNOSIS — R0683 Snoring: Secondary | ICD-10-CM | POA: Diagnosis not present

## 2024-02-16 DIAGNOSIS — E669 Obesity, unspecified: Secondary | ICD-10-CM

## 2024-02-16 DIAGNOSIS — R519 Headache, unspecified: Secondary | ICD-10-CM

## 2024-02-16 DIAGNOSIS — Z9189 Other specified personal risk factors, not elsewhere classified: Secondary | ICD-10-CM

## 2024-02-16 MED ORDER — LISINOPRIL 20 MG PO TABS: 20.0000 mg | ORAL_TABLET | Freq: Every day | ORAL | 2 refills | Status: DC

## 2024-02-19 ENCOUNTER — Other Ambulatory Visit (HOSPITAL_COMMUNITY): Payer: Self-pay

## 2024-02-23 ENCOUNTER — Ambulatory Visit (HOSPITAL_COMMUNITY): Payer: Medicaid Other | Admitting: Mental Health

## 2024-02-23 ENCOUNTER — Encounter (HOSPITAL_COMMUNITY): Payer: Self-pay

## 2024-02-25 ENCOUNTER — Ambulatory Visit: Payer: Medicaid Other | Admitting: Urgent Care

## 2024-02-25 ENCOUNTER — Encounter: Payer: Self-pay | Admitting: Neurology

## 2024-02-25 NOTE — Procedures (Signed)
 Physician Interpretation:     [facility] POLYSOMNOGRAPHY  INTERPRETATION REPORT   STUDY DATE:  02/16/2024     PATIENT NAME:  Kristi Lowe         DATE OF BIRTH:  2006-02-18  PATIENT ID:  [id number]    TYPE OF STUDY:  PSG  READING PHYSICIAN: Huston Foley, MD, PhD   SCORING TECHNICIAN: Margaretann Loveless, RPSGT     Referred by: Maretta Bees, PA  ? History and Indication for Testing: 18 year old female with an underlying medical history of hypertension, prediabetes, OCD, migraine headaches, social anxiety, general anxiety disorder and obesity, who reports snoring and excessive daytime somnolence is frequent morning headaches. Her Epworth sleepiness score is 11 out of 24, fatigue severity score is 45 out of 63.  Height: 65 in Weight: 235 lb (BMI 39) Neck Size: 17 in    MEDICATIONS: Prozac, Prinivil, Semaglutide   TECHNICAL DESCRIPTION: A registered sleep technologist  was in attendance for the duration of the recording.  Data collection, scoring, video monitoring, and reporting were performed in compliance with the AASM Manual for the Scoring of Sleep and Associated Events; (Hypopnea is scored based on the criteria listed in Section VIII D. 1b in the AASM Manual V2.6 using a 4% oxygen desaturation rule or Hypopnea is scored based on the criteria listed in Section VIII D. 1a in the AASM Manual V2.6 using 3% oxygen desaturation and /or arousal rule).   SLEEP CONTINUITY AND SLEEP ARCHITECTURE:  Lights-out was at 21:22: and lights-on at  04:59:, with a total recording time of 7 hours, 37.5 min. Total sleep time ( TST) was 363.5 minutes with a decreased sleep efficiency at 79.5%. There was  7.7% REM sleep.  BODY POSITION:  TST was divided  between the following sleep positions: 0.0% supine;  98.5% lateral;  2% prone. Duration of total sleep and percent of total sleep in their respective position is as follows: supine 00 minutes (0%), non-supine 364 minutes (100%);  right 164 minutes (45%), left 194 minutes (53%), and prone 05 minutes (2%).  Total supine REM sleep time was 00 minutes (0% of total REM sleep).  Sleep latency was increased at 34.5 minutes.  REM sleep latency was borderline increased at 115.0 minutes. Of the total sleep time, the percentage of stage N1 sleep was 2.6%, stage N2 sleep was 62%, which is increased, stage N3 sleep was 27.8%, which is mildly increased, and REM sleep was 7.7%, which is reduced. Wake after sleep onset (WASO) time accounted for 59.5 minutes with minimal to mild sleep fragmentation noted.   RESPIRATORY MONITORING:   Based on CMS criteria (using a 4% oxygen desaturation rule for scoring hypopneas), there were 1 apneas (1 obstructive; 0 central; 0 mixed), and 5 hypopneas.  Apnea index was 0.2. Hypopnea index was 0.8. The apnea-hypopnea index was 1.0/hour overall (0.0 supine, 2 non-supine; 2.1 REM, 0.0 supine REM).  There were 0 respiratory effort-related arousals (RERAs).  The RERA index was 0 events/h. Total respiratory disturbance index (RDI) was 1.0 events/h. RDI results showed: supine RDI  0.0 /h; non-supine RDI 1.0 /h; REM RDI 2.1 /h, supine REM RDI 0.0 /h.   Based on AASM criteria (using a 3% oxygen desaturation and /or arousal rule for scoring hypopneas), there were 1 apneas (1 obstructive; 0 central; 0 mixed), and 16 hypopneas. Apnea index was 0.2. Hypopnea index was 2.6. The apnea-hypopnea index was 2.8 overall (0.0 supine, 6 non-supine; 6.4 REM, 0.0 supine REM).  There were  0 respiratory effort-related arousals (RERAs).  The RERA index was 0 events/h. Total respiratory disturbance index (RDI) was 2.8 events/h. RDI results showed: supine RDI  0.0 /h; non-supine RDI 2.8 /h; REM RDI 6.4 /h, supine REM RDI 0.0 /h.  OXIMETRY: Oxyhemoglobin Saturation Nadir during sleep was at  89% from a mean of 97%.  Of the Total sleep time (TST)   hypoxemia (=<88%) was present for  0.0 minutes, or 0.0% of total sleep time.    LIMB MOVEMENTS:  There were 0 periodic limb movements of sleep (0.0/hr), of which 0 (0.0/hr) were associated with an arousal.   AROUSAL: There were 95 arousals in total, for an arousal index of 16 arousals/hour.  Of these, 8 were identified as respiratory-related arousals (1 /h), 0 were PLM-related arousals (0 /h), and 111 were non-specific arousals (18 /h).    EEG: Review of the EEG showed no abnormal electrical discharges and symmetrical bihemispheric findings.    EKG: The EKG revealed normal sinus rhythm (NSR). The average heart rate during sleep was 85 bpm.  AUDIO/VIDEO REVIEW: The audio and video review did not show any abnormal or unusual behaviors, movements, phonations or vocalizations. The patient took 1 restroom break. Snoring was noted, intermittently, in the mild range.  POST-STUDY QUESTIONNAIRE: Post study, the patient indicated, that sleep was the same as usual.   IMPRESSION:    1. Primary Snoring 2. Dysfunctions associated with sleep stages or arousal from sleep  RECOMMENDATIONS:  1. This study does not demonstrate any significant obstructive or central sleep disordered breathing with an AHI of less than 5/hour - Her AHI was 1.0/hour - and oxygen saturations at or above 89% for the night. Mild intermittent snoring was noted, with absence of supine sleep. Treatment with a positive airway pressure device, such as CPAP or autoPAP is not indicated. Weight loss may aid in reducing her snoring.  2. This study shows some sleep fragmentation and abnormal sleep stage percentages; these are nonspecific findings and per se do not signify an intrinsic sleep disorder or a cause for the patient's sleep-related symptoms. Causes include (but are not limited to) the first night effect of the sleep study, circadian rhythm disturbances, medication effect or an underlying mood disorder or medical problem.  3. The patient should be cautioned not to drive, work at heights, or operate dangerous or heavy equipment when  tired or sleepy. Review and reiteration of good sleep hygiene measures should be pursued with any patient. 4. The patient will be advised to follow up with the referring provider, who will be notified of the test results.  I certify that I have reviewed the entire raw data recording prior to the issuance of this report in accordance with the Standards of Accreditation of the American Academy of Sleep Medicine (AASM).  Huston Foley, MD, PhD Medical Director, [facility]) Diplomat, ABPN (Neurology and Sleep)               Technical Report:   General Information  Name: Kristi Lowe, Kristi Lowe BMI: 39.11 Physician: Huston Foley, MD  ID: [id number] Height: 65.0 in Technician: Margaretann Loveless, RPSGT  Sex: Female Weight: 235.0 lb Record: [id number]  Age: 59 [03-15-2006] Date: 02/16/2024    Medical & Medication History    18 year old female with an underlying medical history of hypertension, prediabetes, OCD, migraine headaches, social anxiety, general anxiety disorder and obesity, who reports snoring and excessive daytime somnolence is frequent morning headaches.  Prozac, Prinivil, Semaglutide   Sleep Disorder  Comments   The patient came into the sleep lab for a PSG. One restroom break. EKG did not show any obvious cardiac arrhythmias. Mild snoring. All sleep stages witnessed. Respiratory events scored with a 4% desat. The patient slept mostly lateral. AHI was 0.5 after 2 hrs of TST. Some spontaneous arousals.     Lights out: 09:22:41 PM Lights on: 04:59:50 AM   Time Total Supine Side Prone Upright  Recording (TRT) 7h 37.43m 0h 5.68m 7h 25.77m 0h 6.17m 0h 0.69m  Sleep (TST) 6h 3.43m 0h 0.20m 5h 58.43m 0h 5.34m 0h 0.66m   Latency N1 N2 N3 REM Onset Per. Slp. Eff.  Actual 0h 34.50m 0h 38.83m 0h 54.53m 1h 55.80m 0h 34.21m 0h 37.90m 79.45%   Stg Dur Wake N1 N2 N3 REM  Total 58.5 9.5 225.0 101.0 28.0  Supine 0.0 0.0 0.0 0.0 0.0  Side 57.5 8.0 221.0 101.0 28.0   Prone 1.0 1.5 4.0 0.0 0.0  Upright 0.0 0.0 0.0 0.0 0.0   Stg % Wake N1 N2 N3 REM  Total 13.9 2.6 61.9 27.8 7.7  Supine 0.0 0.0 0.0 0.0 0.0  Side 13.6 2.2 60.8 27.8 7.7  Prone 0.2 0.4 1.1 0.0 0.0  Upright 0.0 0.0 0.0 0.0 0.0     Apnea Summary Sub Supine Side Prone Upright  Total 1 Total 1 0 1 0 0    REM 0 0 0 0 0    NREM 1 0 1 0 0  Obs 1 REM 0 0 0 0 0    NREM 1 0 1 0 0  Mix 0 REM 0 0 0 0 0    NREM 0 0 0 0 0  Cen 0 REM 0 0 0 0 0    NREM 0 0 0 0 0   Rera Summary Sub Supine Side Prone Upright  Total 0 Total 0 0 0 0 0    REM 0 0 0 0 0    NREM 0 0 0 0 0   Hypopnea Summary Sub Supine Side Prone Upright  Total 16 Total 16 0 16 0 0    REM 3 0 3 0 0    NREM 13 0 13 0 0   4% Hypopnea Summary Sub Supine Side Prone Upright  Total (4%) 5 Total 5 0 5 0 0    REM 1 0 1 0 0    NREM 4 0 4 0 0     AHI Total Obs Mix Cen  2.81 Apnea 0.17 0.17 0.00 0.00   Hypopnea 2.64 -- -- --  0.99 Hypopnea (4%) 0.83 -- -- --    Total Supine Side Prone Upright  Position AHI 2.81 0.00 2.85 0.00 0.00  REM AHI 6.43   NREM AHI 2.50   Position RDI 2.81 0.00 2.85 0.00 0.00  REM RDI 6.43   NREM RDI 2.50    4% Hypopnea Total Supine Side Prone Upright  Position AHI (4%) 0.99 0.00 1.01 0.00 0.00  REM AHI (4%) 2.14   NREM AHI (4%) 0.89   Position RDI (4%) 0.99 0.00 1.01 0.00 0.00  REM RDI (4%) 2.14   NREM RDI (4%) 0.89    Desaturation Information Threshold: 2% <100% <90% <80% <70% <60% <50% <40%  Supine 0.0 0.0 0.0 0.0 0.0 0.0 0.0  Side 105.0 2.0 0.0 0.0 0.0 0.0 0.0  Prone 3.0 0.0 0.0 0.0 0.0 0.0 0.0  Upright 0.0 0.0 0.0 0.0 0.0 0.0 0.0  Total 108.0 2.0 0.0 0.0 0.0 0.0 0.0  Index 15.4 0.3 0.0 0.0 0.0 0.0 0.0   Threshold: 3% <100% <90% <80% <70% <60% <50% <40%  Supine 0.0 0.0 0.0 0.0 0.0 0.0 0.0  Side 48.0 2.0 0.0 0.0 0.0 0.0 0.0  Prone 0.0 0.0 0.0 0.0 0.0 0.0 0.0  Upright 0.0 0.0 0.0 0.0 0.0 0.0 0.0  Total 48.0 2.0 0.0 0.0 0.0 0.0 0.0  Index 6.8 0.3 0.0 0.0 0.0 0.0 0.0   Threshold: 4%  <100% <90% <80% <70% <60% <50% <40%  Supine 0.0 0.0 0.0 0.0 0.0 0.0 0.0  Side 19.0 2.0 0.0 0.0 0.0 0.0 0.0  Prone 0.0 0.0 0.0 0.0 0.0 0.0 0.0  Upright 0.0 0.0 0.0 0.0 0.0 0.0 0.0  Total 19.0 2.0 0.0 0.0 0.0 0.0 0.0  Index 2.7 0.3 0.0 0.0 0.0 0.0 0.0   Threshold: 4% <100% <90% <80% <70% <60% <50% <40%  Supine 0 0 0 0 0 0 0  Side 19 2 0 0 0 0 0  Prone 0 0 0 0 0 0 0  Upright 0 0 0 0 0 0 0  Total 19 2 0 0 0 0 0   Awakening/Arousal Information # of Awakenings 37  Wake after sleep onset 59.5m  Wake after persistent sleep 59.30m   Arousal Assoc. Arousals Index  Apneas 0 0.0  Hypopneas 8 1.3  Leg Movements 0 0.0  Snore 0 0.0  PTT Arousals 0 0.0  Spontaneous 111 18.3  Total 119 19.6  Leg Movement Information PLMS LMs Index  Total LMs during PLMS 0 0.0  LMs w/ Microarousals 0 0.0   LM LMs Index  w/ Microarousal 0 0.0  w/ Awakening 0 0.0  w/ Resp Event 0 0.0  Spontaneous 0 0.0  Total 0 0.0     Desaturation threshold setting: 4% Minimum desaturation setting: 10 seconds SaO2 nadir: 89% The longest event was a 45 sec obstructive Hypopnea with a minimum SaO2 of 95%. The lowest SaO2 was 94% associated with a 23 sec obstructive Hypopnea. EKG Rates EKG Avg Max Min  Awake 90 126 72  Asleep 85 119 70  EKG Events: Tachycardia

## 2024-02-26 ENCOUNTER — Other Ambulatory Visit: Payer: Self-pay

## 2024-02-26 NOTE — Telephone Encounter (Signed)
 Medication requested is not prescribed by PCP

## 2024-02-27 DIAGNOSIS — Z419 Encounter for procedure for purposes other than remedying health state, unspecified: Secondary | ICD-10-CM | POA: Diagnosis not present

## 2024-02-29 ENCOUNTER — Ambulatory Visit (INDEPENDENT_AMBULATORY_CARE_PROVIDER_SITE_OTHER): Admitting: Urgent Care

## 2024-02-29 VITALS — BP 106/71 | HR 95 | Wt 233.0 lb

## 2024-02-29 DIAGNOSIS — L989 Disorder of the skin and subcutaneous tissue, unspecified: Secondary | ICD-10-CM | POA: Diagnosis not present

## 2024-02-29 DIAGNOSIS — I10 Essential (primary) hypertension: Secondary | ICD-10-CM

## 2024-02-29 DIAGNOSIS — R7303 Prediabetes: Secondary | ICD-10-CM | POA: Diagnosis not present

## 2024-02-29 DIAGNOSIS — R635 Abnormal weight gain: Secondary | ICD-10-CM | POA: Diagnosis not present

## 2024-02-29 NOTE — Patient Instructions (Signed)
 Continue your [phone] as prescribed. Please try to eat 4-6 small meals daily, jump starting your metabolism in the morning. Increase your physical activity - 20-30 minutes daily is encouraged.   Take a picture of the skin lesion you are concerned of. Monitor it for the next 6-12 months, if any changes, we will perform a punch biopsy.  Return for recheck in 3 months.

## 2024-02-29 NOTE — Progress Notes (Unsigned)
 Established Patient Office Visit  Subjective:  Patient ID: Kristi Lowe, female    DOB: 20-Dec-2005  Age: 18 y.o. MRN: [id number]  Chief Complaint  Patient presents with   Follow-up    2 month follow up. She has a dark brown mole near her bikini line that she is concerned about.    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 11/17/2023    Class: Chronic   Encounter for weight loss counseling 11/16/2023   Daytime somnolence 11/16/2023   Snoring 11/16/2023   Insertion of Nexplanon 10/30/2023   Weight gain 04/16/2023   Hypertension 04/16/2023   GAD (generalized anxiety disorder) wo panic attacks 11/03/2021   Headache 10/28/2021   Migraine without aura and without status migrainosus, not intractable 10/28/2021   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   Rubella non-immune status, antepartum    Social anxiety disorder 11/17/2023   Past Surgical History:  Procedure Laterality Date   NO PAST SURGERIES     Social History   Socioeconomic History   Marital status: Single    Spouse name: Not on file   Number of children: Not on file   Years of education: Not on file   Highest education level: Some college, no degree  Occupational History    Comment: [employer]  Tobacco Use   Smoking status: Never    Passive exposure: Never   Smokeless tobacco: Never  Vaping Use   Vaping status: Never Used  Substance and Sexual Activity   Alcohol use: Never   Drug use: Never   Sexual activity: Yes    Birth  control/protection: Implant    Comment: POPs  Other Topics Concern   Not on file  Social History Narrative   Janeshia lives with mom, dad, and sister.    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 She does not the best, but not the worst in school.    She would like to go to a community college and the transfer to a university for Public relations account executive.       At 17yo (2025), lives alone with boyfriend of 6 years + daughter (born 2024)   Currently taking college courses online, with a goal of EMS program   Working as well at a print shop   Family is nearby and supportive   Social Drivers of Health   Financial Resource Strain: Low Risk  (11/15/2023)   Overall Financial Resource Strain (CARDIA)    Difficulty of Paying Living Expenses: Not hard at all  Food Insecurity: No Food Insecurity (11/15/2023)   Hunger Vital Sign    Worried About Running Out of Food in the Last Year: Never true    Ran Out of Food in the Last Year: Never true  Transportation Needs: No Transportation Needs (11/15/2023)   PRAPARE - Administrator, Civil Service (Medical): No    Lack of Transportation (Non-Medical): No  Physical Activity: Sufficiently Active (11/15/2023)   Exercise Vital Sign    Days of Exercise per Week: 7 days  Minutes of Exercise per Session: 30 min  Stress: No Stress Concern Present (11/15/2023)   [employer] of Occupational Health - Occupational Stress Questionnaire    Feeling of Stress : Not at all  Social Connections: Moderately Isolated (11/15/2023)   Social Connection and Isolation Panel [NHANES]    Frequency of Communication with Friends and Family: More than three times a week    Frequency of Social Gatherings with Friends and Family: Twice a week    Attends Religious Services: 1 to 4 times per year    Active Member of [employer] or Organizations: No    Attends Engineer, structural: Not on file    Marital Status: Never married  Intimate Partner Violence: Not  At Risk (08/19/2022)   Humiliation, Afraid, Rape, and Kick questionnaire    Fear of Current or Ex-Partner: No    Emotionally Abused: No    Physically Abused: No    Sexually Abused: No      ROS: as noted in HPI  Objective:     BP 106/71   Pulse 95   Wt 233 lb (105.7 kg)   SpO2 97%   BMI 38.77 kg/m  BP Readings from Last 3 Encounters:  02/29/24 106/71  01/11/24 121/76  12/28/23 116/74   Wt Readings from Last 3 Encounters:  02/29/24 233 lb (105.7 kg)  01/11/24 235 lb (106.6 kg) (>99%, Z= 2.38)*  12/28/23 237 lb (107.5 kg) (>99%, Z= 2.40)*   * Growth percentiles are based on CDC (Girls, 2-20 Years) data.      Physical Exam   No results found for any visits on 02/29/24.  Last CBC Lab Results  Component Value Date   WBC 9.1 11/16/2023   HGB 12.8 11/16/2023   HCT 40.1 11/16/2023   MCV 79.3 11/16/2023   MCH 26.2 08/21/2022   RDW 14.1 11/16/2023   PLT 365.0 11/16/2023   Last metabolic panel Lab Results  Component Value Date   GLUCOSE 84 11/16/2023   NA 137 11/16/2023   K 4.5 11/16/2023   CL 105 11/16/2023   CO2 23 11/16/2023   BUN 14 11/16/2023   CREATININE 0.63 11/16/2023   GFR 128.30 11/16/2023   CALCIUM 9.5 11/16/2023   PROT 7.4 11/16/2023   ALBUMIN 4.3 11/16/2023   LABGLOB 2.6 04/16/2023   AGRATIO 1.7 04/16/2023   BILITOT 0.3 11/16/2023   ALKPHOS 100 11/16/2023   AST 15 11/16/2023   ALT 19 11/16/2023   ANIONGAP 10 08/19/2022   Last lipids No results found for: "CHOL", "HDL", "LDLCALC", "LDLDIRECT", "TRIG", "CHOLHDL" Last hemoglobin A1c Lab Results  Component Value Date   HGBA1C 5.9 11/16/2023   Last thyroid functions Lab Results  Component Value Date   TSH 1.03 11/16/2023   Last vitamin D No results found for: "25OHVITD2", "25OHVITD3", "VD25OH" Last vitamin B12 and Folate No results found for: "VITAMINB12", "FOLATE"    The ASCVD Risk score (Arnett DK, et al., 2019) failed to calculate for the following reasons:   The 2019 ASCVD risk  score is only valid for ages 38 to 39  Assessment & Plan:  There are no diagnoses linked to this encounter.   No follow-ups on file.   Mandy Second, PA

## 2024-03-01 ENCOUNTER — Encounter: Payer: Self-pay | Admitting: Urgent Care

## 2024-03-02 ENCOUNTER — Other Ambulatory Visit (HOSPITAL_COMMUNITY)

## 2024-03-07 ENCOUNTER — Other Ambulatory Visit (HOSPITAL_COMMUNITY)

## 2024-03-07 DIAGNOSIS — F411 Generalized anxiety disorder: Secondary | ICD-10-CM

## 2024-03-07 DIAGNOSIS — F4010 Social phobia, unspecified: Secondary | ICD-10-CM

## 2024-03-07 DIAGNOSIS — F429 Obsessive-compulsive disorder, unspecified: Secondary | ICD-10-CM

## 2024-03-07 DIAGNOSIS — Z79899 Other long term (current) drug therapy: Secondary | ICD-10-CM

## 2024-03-07 NOTE — Progress Notes (Signed)
 Patient arrives today for her due labs of Vitamin D  and B12. Patient has no questions, the right AC was prepped for venipuncture and was unsuccessful. The Left AC was then prepped and I was able to collect one tiger top with a 22 G straight needle. Patient tolerated well and without complaint.

## 2024-03-10 LAB — VITAMIN D 25 HYDROXY (VIT D DEFICIENCY, FRACTURES): Vit D, 25-Hydroxy: 20.4 ng/mL — ABNORMAL LOW (ref 30.0–100.0)

## 2024-03-10 LAB — B12 AND FOLATE PANEL
Folate: 8.2 ng/mL (ref >3.0)
Vitamin B-12: 405 pg/mL (ref 232–1245)

## 2024-03-14 ENCOUNTER — Encounter (HOSPITAL_COMMUNITY): Payer: Self-pay | Admitting: Student

## 2024-03-28 DIAGNOSIS — Z419 Encounter for procedure for purposes other than remedying health state, unspecified: Secondary | ICD-10-CM | POA: Diagnosis not present

## 2024-04-12 ENCOUNTER — Encounter: Payer: Self-pay | Admitting: Urgent Care

## 2024-04-14 ENCOUNTER — Encounter (HOSPITAL_COMMUNITY): Admitting: Student

## 2024-04-22 ENCOUNTER — Ambulatory Visit (HOSPITAL_COMMUNITY): Admitting: Mental Health

## 2024-04-22 ENCOUNTER — Encounter (HOSPITAL_COMMUNITY): Payer: Self-pay

## 2024-04-22 IMAGING — US US MFM OB FOLLOW-UP
1 series · 13 of 28 positions shown · non-contrast
Comparison: none

[Series 1: us mfm ob follow-up · 113 acquisitions, 13 frames shown]
[im 5/113]
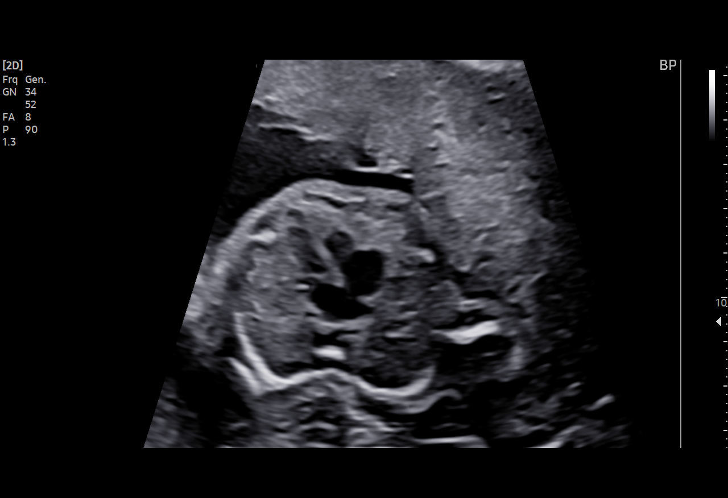
[im 13/113]
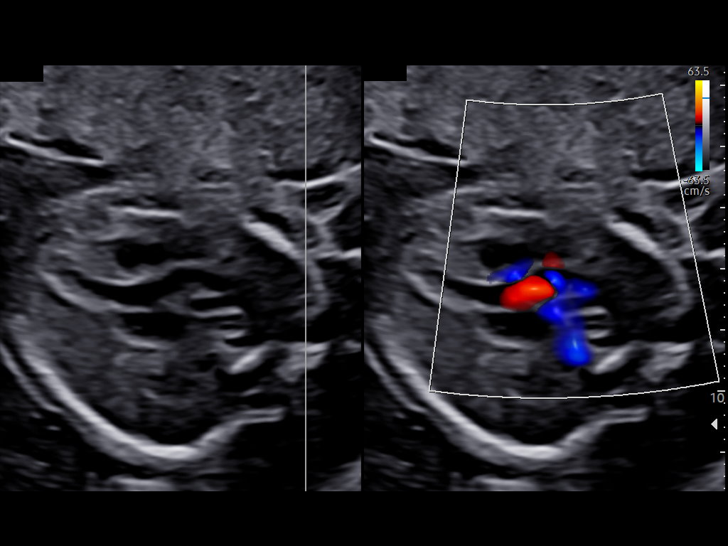
[im 21/113]
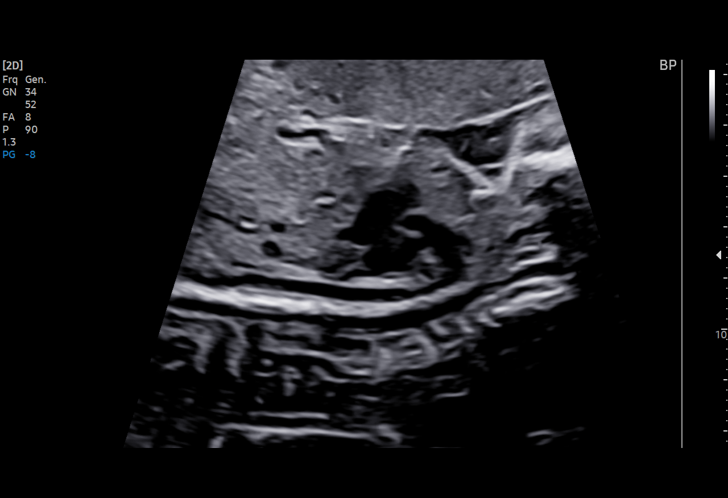
[im 30/113]
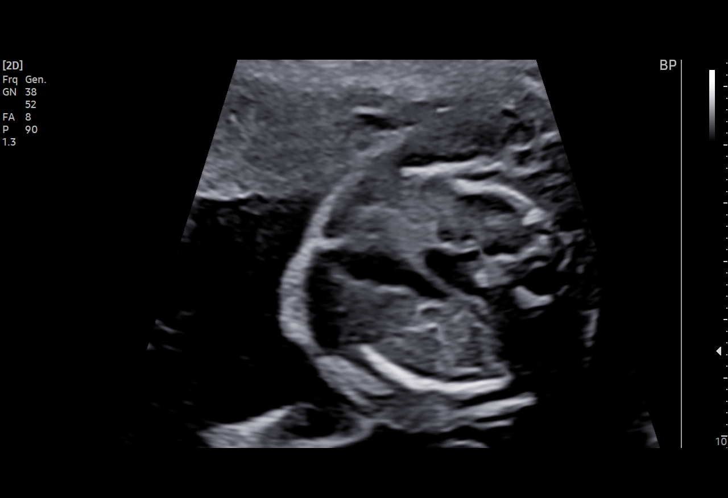
[im 38/113]
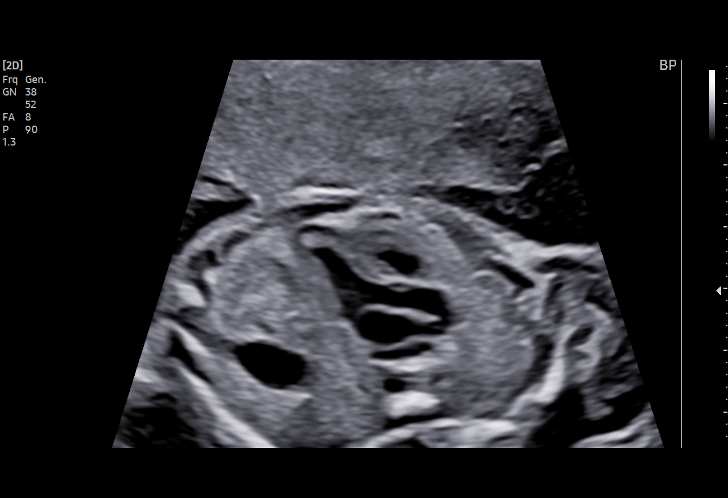
[im 46/113]
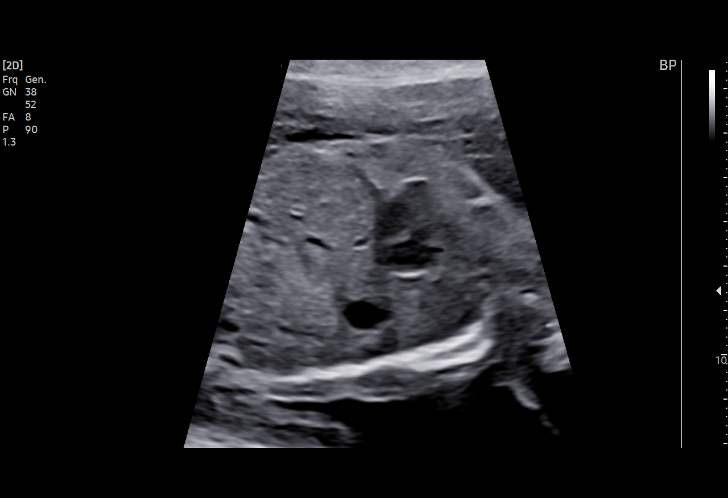
[im 59/113]
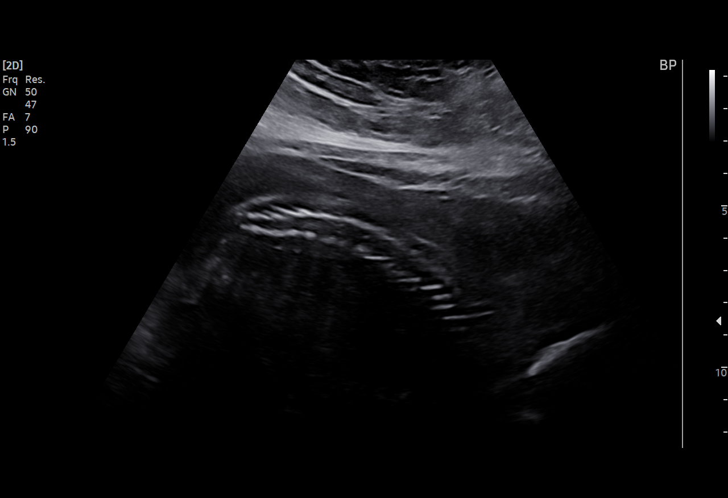
[im 67/113]
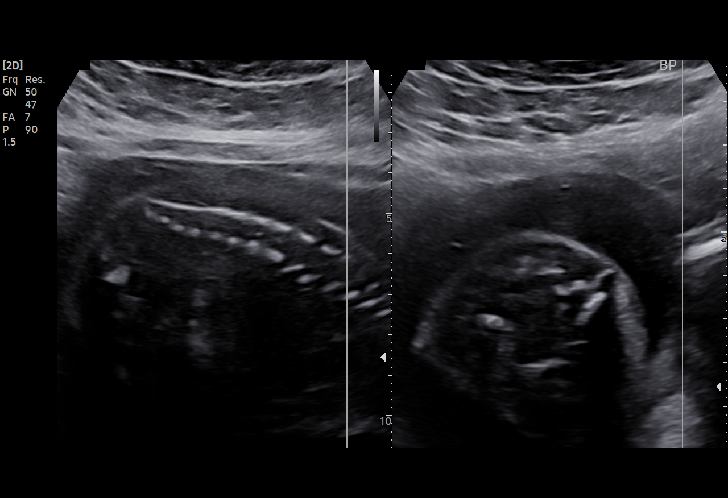
[im 75/113]
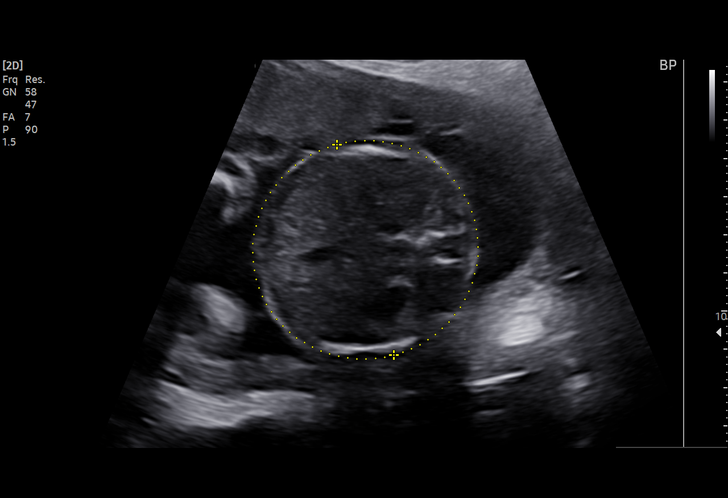
[im 83/113]
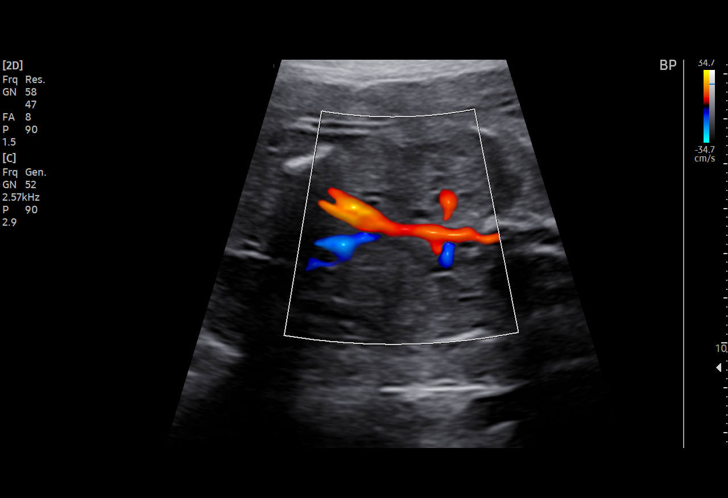
[im 92/113]
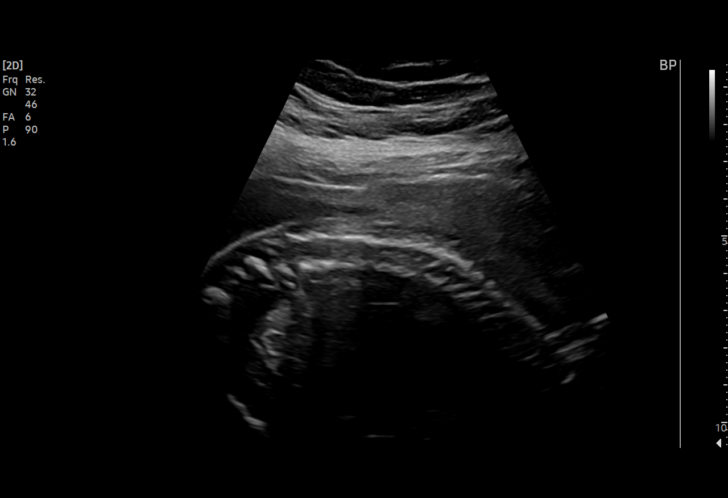
[im 100/113]
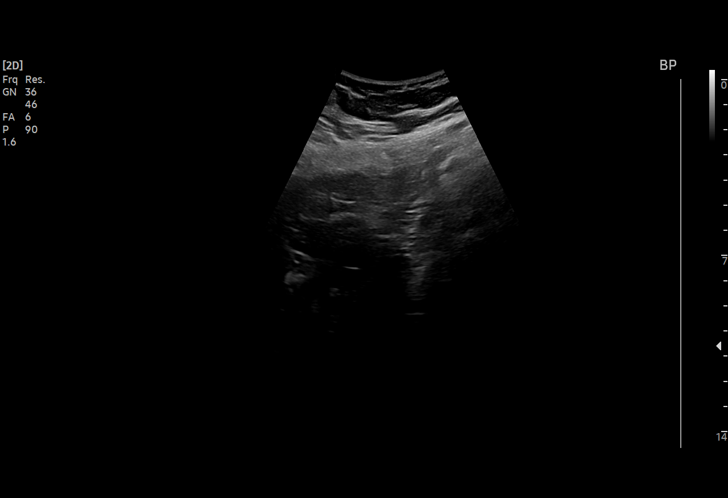
[im 108/113]
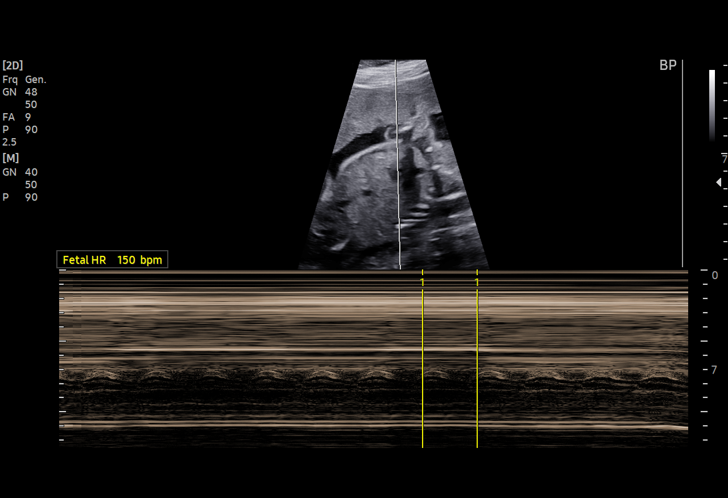

[13 of 28 positions shown; findings below may reference images not displayed]

Indications

 Obesity complicating pregnancy, second
 trimester (BMI 31)
 Teen pregnancy
 LR NIPS/neg AFP/Horizon neg
 Encounter for other antenatal screening
 follow-up
 22 weeks gestation of pregnancy
Vital Signs

 BP:          143/57
Fetal Evaluation

 Num Of Fetuses:         1
 Fetal Heart Rate(bpm):  150
 Cardiac Activity:       Observed
 Presentation:           Cephalic
 Placenta:               Anterior
 P. Cord Insertion:      Previously Visualized

 Amniotic Fluid
 AFI FV:      Within normal limits

                             Largest Pocket(cm)

Biometry

 BPD:     56.04  mm     G. Age:  23w 1d         85  %    CI:        76.76   %    70 - 86
                                                         FL/HC:      21.9   %    18.4 -
 HC:    202.61   mm     G. Age:  22w 3d         55  %    HC/AC:      1.11        1.06 -
 AC:    182.09   mm     G. Age:  23w 0d         75  %    FL/BPD:     79.1   %    71 - 87
 FL:      44.34  mm     G. Age:  24w 4d         98  %    FL/AC:      24.4   %    20 - 24
 HUM:      38.8  mm     G. Age:  23w 6d         93  %
 CER:      24.3  mm     G. Age:  22w 2d         80  %

 LV:        6.5  mm
 CM:        4.3  mm

 Est. FW:     607  gm      1 lb 5 oz     98  %
OB History

 Gravidity:    1
Gestational Age

 LMP:           23w 0d        Date:  11/26/21                  EDD:   09/02/22
 U/S Today:     23w 2d                                        EDD:   08/31/22
 Best:          22w 0d     Det. By:  Early Ultrasound         EDD:   09/09/22
                                     (02/11/22)
Anatomy

 Cranium:               Appears normal         Aortic Arch:            Appears normal
 Cavum:                 Appears normal         Ductal Arch:            Appears normal
 Ventricles:            Appears normal         Diaphragm:              Appears normal
 Choroid Plexus:        Previously seen        Stomach:                Appears normal, left
                                                                       sided
 Cerebellum:            Previously seen        Abdomen:                Appears normal
 Posterior Fossa:       Previously seen        Abdominal Wall:         Previously seen
 Nuchal Fold:           Previously seen        Cord Vessels:           Appears normal (3
                                                                       vessel cord)
 Face:                  Orbits and profile     Kidneys:                Appear normal
                        previously seen
 Lips:                  Appears normal         Bladder:                Appears normal
 Thoracic:              Appears normal         Spine:                  Limited views
                                                                       appear normal
 Heart:                 Appears normal         Upper Extremities:      Previously seen
                        (4CH, axis, and
                        situs)
 RVOT:                  Appears normal         Lower Extremities:      Previously seen
 LVOT:                  Appears normal

 Other:  VC, 3VV, 3VTV visualized. Heels/feet, open hands/5th digits, nasal
         bone, lenses, maxilla, mandible and falx previously visualized Female
         gender previously seen. Technically difficult due to maternal habitus.
Cervix Uterus Adnexa

 Cervix
 Length:            4.3  cm.
 Normal appearance by transabdominal scan.

 Uterus
 Normal shape and size.

 Right Ovary
 Not visualized.
 Left Ovary
 Not visualized.
Comments

 This patient was seen for a follow up growth scan due to a
 large for gestational age fetus that was noted on her prior
 exam.  The FOB is 6 feet 6 inches tall.  She denies any
 problems since her last exam.
 She was informed that the fetal growth continues to measure
 large for her gestational [AGE]th percentile).  There was
 normal amniotic fluid noted.
 There were no obvious fetal anomalies noted on today's
 exam.  The limitations of ultrasound in the detection of all
 anomalies was discussed.
 Due to the large for gestational age fetus, a follow-up growth
 scan was scheduled in 4 weeks.

## 2024-04-25 ENCOUNTER — Other Ambulatory Visit: Payer: Self-pay

## 2024-04-25 ENCOUNTER — Encounter: Payer: Self-pay | Admitting: Urgent Care

## 2024-04-25 DIAGNOSIS — I10 Essential (primary) hypertension: Secondary | ICD-10-CM

## 2024-04-25 DIAGNOSIS — F429 Obsessive-compulsive disorder, unspecified: Secondary | ICD-10-CM

## 2024-04-25 DIAGNOSIS — F4010 Social phobia, unspecified: Secondary | ICD-10-CM

## 2024-04-25 DIAGNOSIS — F411 Generalized anxiety disorder: Secondary | ICD-10-CM

## 2024-04-25 MED ORDER — FLUOXETINE HCL 10 MG PO CAPS: 10.0000 mg | ORAL_CAPSULE | Freq: Every morning | ORAL | 0 refills | Status: DC

## 2024-04-25 MED ORDER — LISINOPRIL 20 MG PO TABS: 20.0000 mg | ORAL_TABLET | Freq: Every day | ORAL | 2 refills | Status: DC

## 2024-04-28 DIAGNOSIS — Z419 Encounter for procedure for purposes other than remedying health state, unspecified: Secondary | ICD-10-CM | POA: Diagnosis not present

## 2024-05-06 ENCOUNTER — Encounter: Payer: Self-pay | Admitting: Urgent Care

## 2024-05-06 DIAGNOSIS — R7303 Prediabetes: Secondary | ICD-10-CM

## 2024-05-06 DIAGNOSIS — R635 Abnormal weight gain: Secondary | ICD-10-CM

## 2024-05-06 MED ORDER — SEMAGLUTIDE-WEIGHT MANAGEMENT 2.4 MG/0.75ML ~~LOC~~ SOAJ: 2.4000 mg | SUBCUTANEOUS | 3 refills | Status: AC

## 2024-05-28 DIAGNOSIS — Z419 Encounter for procedure for purposes other than remedying health state, unspecified: Secondary | ICD-10-CM | POA: Diagnosis not present

## 2024-05-31 ENCOUNTER — Ambulatory Visit: Admitting: Urgent Care

## 2024-06-28 DIAGNOSIS — Z419 Encounter for procedure for purposes other than remedying health state, unspecified: Secondary | ICD-10-CM | POA: Diagnosis not present

## 2024-07-13 ENCOUNTER — Other Ambulatory Visit (HOSPITAL_COMMUNITY): Payer: Self-pay

## 2024-07-13 ENCOUNTER — Telehealth: Payer: Self-pay

## 2024-07-13 NOTE — Telephone Encounter (Signed)
 Pharmacy Patient Advocate Encounter   Received notification from Onbase that prior authorization for Wegovy  2.4MG /0.75ML auto-injectors  is required/requested.   Insurance verification completed.   The patient is insured through [employer] .   Per test claim: PA required; PA submitted to above mentioned insurance via Latent Key/confirmation #/EOC [id number] Status is pending

## 2024-07-13 NOTE — Telephone Encounter (Signed)
 Pharmacy Patient Advocate Encounter  Received notification from [employer] that Prior Authorization for Wegovy  2.4MG /0.75ML auto-injectors  has been DENIED.  See denial reason below. No denial letter attached in CMM. Will attach denial letter to Media tab once received.   PA #/Case ID/Reference #: [id number]

## 2024-07-15 NOTE — Telephone Encounter (Signed)
 Will defer medication changes to pt's new PCP, Dr. Catherine in [facility] [place]. Pt is no longer following with me.

## 2024-07-17 ENCOUNTER — Emergency Department (HOSPITAL_COMMUNITY)
Admission: EM | Admit: 2024-07-17 | Discharge: 2024-07-17 | Disposition: A | Discharge status: Home / Self Care | Attending: Emergency Medicine | Admitting: Emergency Medicine

## 2024-07-17 DIAGNOSIS — R059 Cough, unspecified: Secondary | ICD-10-CM | POA: Diagnosis not present

## 2024-07-17 DIAGNOSIS — H1032 Unspecified acute conjunctivitis, left eye: Secondary | ICD-10-CM | POA: Diagnosis not present

## 2024-07-17 DIAGNOSIS — H5789 Other specified disorders of eye and adnexa: Secondary | ICD-10-CM | POA: Diagnosis not present

## 2024-07-17 DIAGNOSIS — J029 Acute pharyngitis, unspecified: Secondary | ICD-10-CM | POA: Insufficient documentation

## 2024-07-17 DIAGNOSIS — H109 Unspecified conjunctivitis: Secondary | ICD-10-CM

## 2024-07-17 LAB — RESP PANEL BY RT-PCR (RSV, FLU A&B, COVID)  RVPGX2
Influenza A by PCR: NEGATIVE
Influenza B by PCR: NEGATIVE
Resp Syncytial Virus by PCR: NEGATIVE
SARS Coronavirus 2 by RT PCR: NEGATIVE

## 2024-07-17 MED ORDER — ERYTHROMYCIN 5 MG/GM OP OINT
TOPICAL_OINTMENT | Freq: Once | OPHTHALMIC | Status: AC
  Filled 2024-07-17: qty 3.5

## 2024-07-17 NOTE — ED Provider Notes (Signed)
 [place] EMERGENCY DEPARTMENT AT [facility] Provider Note   CSN: [id number] Arrival date & time: 07/17/24  1341     Patient presents with: infection   Kristi Lowe is a 18 y.o. female.   Patient to ED with symptoms of sore throat, minimal cough, runny nose and left eye redness and drainage for the past several days. She reports multiple household members with eye drainage, using polymixin with relief. She has used the eye drops as well without improvement. No fever, vomiting, diarrhea.   The history is provided by the patient. No language interpreter was used.       Prior to Admission medications   Medication Sig Start Date End Date Taking? Authorizing Provider  FLUoxetine  (PROZAC ) 10 MG capsule Take 1 capsule (10 mg total) by mouth every morning. At the same time as the Prozac  20 mg capsule, for a total of 30 mg every morning 04/25/24 06/24/24  Crain, Benton L, PA  lisinopril  (PRINIVIL ) 20 MG tablet Take 1 tablet (20 mg total) by mouth daily. 04/25/24   Crain, Whitney L, PA  Semaglutide -Weight Management 2.4 MG/0.75ML SOAJ Inject 2.4 mg into the skin once a week. 05/06/24 08/26/24  Lowella Benton L, PA    Allergies: Patient has no known allergies.    Review of Systems  Updated Vital Signs BP 119/80 (BP Location: Left Arm)   Pulse (!) 105   Temp (!) 97.5 F (36.4 C) (Temporal)   Resp 18   Ht 5' 4 (1.626 m)   Wt 90.7 kg   SpO2 100%   BMI 34.33 kg/m   Physical Exam Constitutional:      Appearance: She is well-developed.  HENT:     Head: Normocephalic.  Eyes:     Comments: Left eye is erythematous without purulence. PERRL. No swelling.   Cardiovascular:     Rate and Rhythm: Normal rate and regular rhythm.     Heart sounds: No murmur heard. Pulmonary:     Effort: Pulmonary effort is normal.     Breath sounds: Normal breath sounds. No wheezing, rhonchi or rales.  Abdominal:     General: Bowel sounds are normal.     Palpations: Abdomen is soft.      Tenderness: There is no abdominal tenderness. There is no guarding or rebound.  Musculoskeletal:        General: Normal range of motion.     Cervical back: Normal range of motion and neck supple.  Skin:    General: Skin is warm and dry.  Neurological:     General: No focal deficit present.     Mental Status: She is alert and oriented to person, place, and time.     (all labs ordered are listed, but only abnormal results are displayed) Labs Reviewed  RESP PANEL BY RT-PCR (RSV, FLU A&B, COVID)  [id number]    EKG: None  Radiology: No results found.   Procedures   Medications Ordered in the ED - No data to display  Clinical Course as of 07/17/24 1459  Sun Jul 17, 2024  1457 Patient to ED with left eye redness, sore throat, runny nose, mild cough without fever. No eye improvement with polymixin.   COVID pending.   Patient care signed out to [employer], PA-C, pending COVID result. Anticipate discharge.  [SU]    Clinical Course User Index [SU] Odell Balls, PA-C  Medical Decision Making       Final diagnoses:  None    ED Discharge Orders     None          Odell Balls, PA-C 07/17/24 1459    Elnor Jayson LABOR, DO 07/18/24 [phone]

## 2024-07-17 NOTE — ED Provider Notes (Signed)
 Patient signed out to me at shift change.  COVID and flu are negative.  She looks well.  She does have some left-sided conjunctivitis.  Uncertain if this is viral, allergic, bacterial, or medicamentosa.  I have advised that she discontinue her eyedrops for 1 to 2 days to see if she improves.  If she begins to improve, then no further treatment.  If she worsens, then begin treatment with Romycin, which we have given her in the ED today.   Vicky Charleston, PA-C 07/17/24 1651    Pamella Ozell LABOR, DO 07/22/24 1147

## 2024-07-17 NOTE — Discharge Instructions (Signed)
 Your COVID and flu test were negative.  It is possible that your eye is reacting to the eyedrops that you are using.  I recommend pausing the eyedrops for 1 to 2 days.  If you begin to improve, then continue with no additional treatment.  If you have worsening symptoms in that time period, then begin applying the ointment that we gave you in the emergency department.  You can apply the ointment to the eyelashes morning and evening for 5 days.  You can blink the ointment into the eye.  I recommend that you follow-up with an eye doctor or ophthalmologist if not improving after 2 to 3 days.

## 2024-07-17 NOTE — ED Triage Notes (Signed)
 Patient in ED today with complaints of eye infection and cold symptoms. Patient states her left eye is blurry and has drainage. It is red and swollen.

## 2024-07-22 ENCOUNTER — Other Ambulatory Visit: Payer: Self-pay | Admitting: Urgent Care

## 2024-07-22 DIAGNOSIS — I10 Essential (primary) hypertension: Secondary | ICD-10-CM

## 2024-07-25 NOTE — Progress Notes (Signed)
 Psychiatric Initial Adult Assessment  Patient Identification: Kristi Lowe MRN: [id number] Date of Evaluation: 07/29/2024 Referral Source: [name], [place]   Assessment:  Kristi Lowe is a 18 y.o. female with a documented history of OCD, GAD, social anxiety d/o, no suicide attempt or inpatient psych admission, HTN, recurrent headaches, who presents in person to [facility] for follow-up.  She is currently being managed with Prozac  20 mg daily for social anxiety, OCD.  Her intrusive thoughts have improved on the current dose, compulsions including counting, washing, redoing have decreased in frequency and intensity.  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  She has denied any side effects from medications, has had improved mood, decreased anxiety.  There have been no safety concerns and she has denied any active or passive SI/HI/AVH.  Her symptoms were initially caused due to mom's hospitalization and they have been fairly stable.  Plan is to continue same medication management for now, continue Prozac  30 mg daily, follow-up with her in 8 to 12 weeks.  Risk Assessment: An assessment of suicide and violence risk factors was performed as part of this evaluation and is not significantly changed from the last visit. While future psychiatric events cannot be accurately predicted, the patient does not currently require acute inpatient psychiatric care and does not currently meet [place]  involuntary commitment criteria. Patient was given contact information for crisis resources, behavioral health clinic and was instructed to call 911 for emergencies.   Plan:  # OCD w good insight Past medication trials: prozac  Status of problem: Chronic/improving Interventions: Therapist: Bernice GORMAN Rao, [facility] Labs: VitD, B12/folate-ordered 01/01/2024  Continued home prozac  20 mg to 30 mg qAM-she just dispensed 20 mg, provided 60 days of 10  mg  # GAD wo panic attacks  social anxiety d/o # Driving phobia Past medication trials:  Status of problem: Ongoing/improving Interventions: SSRI per above  Health Maintenance PCP: [name], PA   Nexplanon -10/2023, expires 3 years later  # Snoring/frequent HA/daytime somnolence-sleep referral per PCP # HTN-lisinopril  20 mg # Bariatrics-semaglutide   Return to care in: Future Appointments  Date Time Provider Department Center  08/03/2024  1:20 PM Catherine Charlies LABOR, DO [facility] [address]  10/21/2024 10:30 AM Vickie Berber, MD [facility] None   Patient was given contact information for behavioral health clinic and was instructed to call 911 for emergencies.   Patient and plan of care will be discussed with the Attending MD, who agrees with the above statement and plan.   Subjective:  Chief Complaint:  Chief Complaint  Patient presents with   Follow-up   Anxiety   OCD   Medication Refill   History of Present Illness:   I have reviewed the PDMP during this encounter.  Today, patient presented to the clinic unaccompanied.  She reported her mood as good .  When asked about how her medications have been she stated good .  She denied any active or passive SI/HI/AVH.  She reported good sleep and good appetite, denied any physical concerns.  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  Reported that her social anxiety has improved.  She denied feeling depressed.  Stated that her OCD symptoms that includes sometimes I turn off lights and I have to turn it off at a certain number , have decreased in frequency and intensity.  She denied any side effects of medications, reported that 30 mg have been working well for her.  She denied using any substances including alcohol or cigarettes.  Discussed about continuing Prozac  at same dose of 30 mg, risk-benefit and side effects of  medication were discussed.  Encourage patient to take an over-the-counter vitamin D  since her vitamin D  levels were low at the previous assessment.  Patient was also encouraged to resume therapy.  Prescription was sent to the preferred pharmacy.  Plan to follow-up with her in 10 to 12 weeks.  Patient is aware of [facility], 988 and 911 as well.   Review of Systems  Constitutional:  Negative for malaise/fatigue.       Does get sleepy  HENT:  Negative for congestion.   Respiratory:  Negative for shortness of breath.   Cardiovascular:  Negative for chest pain.  Gastrointestinal:  Negative for abdominal pain, constipation, diarrhea, nausea and vomiting.  Neurological:  Positive for headaches. Negative for dizziness, tremors and seizures.      Past Psychiatric History:  Diagnoses: GAD, SocAD, OCD Medication trials:  Prozac  (effective thus far, 04/2023-current) Propranolol  for anxiety and headache (2023, unsure if effective because got pregnant and stopped) Suicide attempts: Denied SIB: Denied Hospitalizations: Denied Previous psychiatrist/therapist: Denied Hx of violence towards others: Denied Current access to guns: Denied Hx of trauma/abuse: Denied  Substance Use History: EtOH:  reports no history of alcohol use. Nicotine:  reports that she has never smoked. She has never been exposed to tobacco smoke. She has never used smokeless tobacco. Marijuana: Denied IV drug use: Denied Stimulants: Denied Opiates: Denied Sedative/hypnotics: Denied Hallucinogens: Denied  Past Medical History: Dx:  has a past medical history of Generalized anxiety disorder (11/03/2021), Headache, History of gestational hypertension, Hypertension, Obsessive-compulsive disorder (11/17/2023), 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 (11/17/2023), Rubella non-immune status, antepartum, and Social anxiety disorder (11/17/2023).  Allergies: Patient has no known allergies.  Head trauma: Denied Seizures:  Denied  Family Psychiatric History:  Suicide: Denied Homicide: Denied Psych hospitalization: Denied BiPD: Denied SCZ/SCzA: Denied Substance use: Denied Mom with anxiety unspecified and ADHD per chart review  Social History:  Housing: In [place] with boyfriend and daughter Income: employed  Family: Entire family is in [place] Education: college [employer] - couple of classes, goal for EMS program Marital Status: single Children: daughter (08/2023) Support: Family, partner Legal: Denied Developmental: Denied  Substance Abuse History in the last 12 months:  No.  Past Medical History:  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   Rubella non-immune status, antepartum    Social anxiety disorder 11/17/2023    Past Surgical History:  Procedure Laterality Date   NO PAST SURGERIES     Family History:  Family History  Problem Relation Age of Onset   Anxiety disorder Mother    ADD / ADHD Mother    Anxiety disorder Maternal Grandmother    Migraines Maternal Grandmother    Aneurysm Maternal Grandmother    Hypertension Paternal Grandfather    Asthma Neg Hx    Cancer Neg Hx    Diabetes Neg Hx    Heart disease Neg Hx    Suicidality Neg Hx        or homicide   Schizophrenia Neg Hx    Bipolar disorder Neg Hx  Drug abuse Neg Hx    Social History:   Social History   Socioeconomic History   Marital status: Single    Spouse name: Not on file   Number of children: Not on file   Years of education: Not on file   Highest education level: Some college, no degree  Occupational History    Comment: [employer]  Tobacco Use   Smoking status: Never    Passive exposure: Never   Smokeless tobacco: Never  Vaping Use   Vaping status: Never Used  Substance and Sexual  Activity   Alcohol use: Never   Drug use: Never   Sexual activity: Yes    Birth control/protection: Implant    Comment: POPs  Other Topics Concern   Not on file  Social History Narrative   Talecia lives with mom, dad, and sister.    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 She does not the best, but not the worst in school.    She would like to go to a community college and the transfer to a university for Public relations account executive.       At 17yo (2025), lives alone with boyfriend of 6 years + daughter (born 2024)   Currently taking college courses online, with a goal of EMS program   Working as well at a print shop   Family is nearby and supportive   Social Drivers of Health   Financial Resource Strain: Low Risk  (11/15/2023)   Overall Financial Resource Strain (CARDIA)    Difficulty of Paying Living Expenses: Not hard at all  Food Insecurity: No Food Insecurity (11/15/2023)   Hunger Vital Sign    Worried About Running Out of Food in the Last Year: Never true    Ran Out of Food in the Last Year: Never true  Transportation Needs: No Transportation Needs (11/15/2023)   PRAPARE - Administrator, Civil Service (Medical): No    Lack of Transportation (Non-Medical): No  Physical Activity: Sufficiently Active (11/15/2023)   Exercise Vital Sign    Days of Exercise per Week: 7 days    Minutes of Exercise per Session: 30 min  Stress: No Stress Concern Present (11/15/2023)   [employer] of Occupational Health - Occupational Stress Questionnaire    Feeling of Stress : Not at all  Social Connections: Moderately Isolated (11/15/2023)   Social Connection and Isolation Panel    Frequency of Communication with Friends and Family: More than three times a week    Frequency of Social Gatherings with Friends and Family: Twice a week    Attends Religious Services: 1 to 4 times per year    Active Member of [employer] or Organizations: No    Attends Engineer, structural: Not  on file    Marital Status: Never married   Additional Social History: updated  Allergies:  No Known Allergies  Current Medications: Current Outpatient Medications  Medication Sig Dispense Refill   FLUoxetine  (PROZAC ) 20 MG capsule Take 1 capsule (20 mg total) by mouth daily. 30 capsule 2   FLUoxetine  (PROZAC ) 10 MG capsule Take 1 capsule (10 mg total) by mouth every morning. At the same time as the Prozac  20 mg capsule, for a total of 30 mg every morning 30 capsule 2   lisinopril  (PRINIVIL ) 20 MG tablet Take 1 tablet (20 mg total) by mouth daily. 30 tablet 2   Semaglutide -Weight Management 2.4 MG/0.75ML SOAJ Inject 2.4 mg into the skin once a week. 3 mL  3   No current facility-administered medications for this visit.   Objective:  Psychiatric Specialty Exam: Body mass index is 36.56 kg/m. BP 130/76   Pulse 92   Ht 5' 4 (1.626 m)   Wt 213 lb (96.6 kg)   BMI 36.56 kg/m   General Appearance: Casual, faily groomed  Eye Contact:  Good    Speech:  Clear, coherent, mildly fast rate, not pressured, interruptible  Volume:  Normal   Mood:  See above  Affect:  Appropriate, congruent, full range  Thought Content: Logical, anxious rumination, intrusive thoughts about bad luck, see above for details  Suicidal Thoughts: See above   Thought Process:  Coherent, goal-directed, linear   Orientation:  A&Ox4   Memory:  Immediate good  Judgment: Good  Insight: Good  Concentration:  Attention and concentration good   Recall:  Good  Fund of Knowledge: Good  Language: Good, fluent  Psychomotor Activity: see above  Akathisia:  See above  AIMS (if indicated): See above if indicated  Assets:  Communication Skills Desire for Improvement Financial Resources/Insurance Housing Intimacy Resilience Social Support Talents/Skills Transportation Vocational/Educational  ADL's:  Intact  Cognition: WNL  Sleep:  See above    Physical Exam Vitals and nursing note reviewed.  Constitutional:       General: She is awake. She is not in acute distress.    Appearance: Normal appearance. She is not ill-appearing, toxic-appearing or diaphoretic.  HENT:     Head: Normocephalic and atraumatic.  Eyes:     Conjunctiva/sclera: Conjunctivae normal.  Pulmonary:     Effort: Pulmonary effort is normal. No respiratory distress.  Neurological:     General: No focal deficit present.     Mental Status: She is alert and oriented to person, place, and time.     Gait: Gait normal.     Metabolic Disorder Labs: Lab Results  Component Value Date   HGBA1C 5.9 11/16/2023   No results found for: PROLACTIN No results found for: CHOL, TRIG, HDL, CHOLHDL, VLDL, LDLCALC Lab Results  Component Value Date   TSH 1.03 11/16/2023    Therapeutic Level Labs: No results found for: LITHIUM No results found for: CBMZ No results found for: VALPROATE  Screenings:  GAD-7    Flowsheet Row Office Visit from 12/28/2023 in [facility] [place] [facility] [place] Counselor from 11/17/2023 in [facility] Office Visit from 11/16/2023 in [facility] [place] Office Visit from 06/24/2023 in [facility] at [facility] Video Visit from 05/06/2023 in [facility] at [facility]  Total GAD-7 Score 2 2 2 4 4    PHQ2-9    Flowsheet Row Clinical Support from 07/29/2024 in [facility] Office Visit from 12/28/2023 in [facility] [place] [facility] [place] Counselor from 11/17/2023 in [facility] Office Visit from 11/16/2023 in [facility] [place] [facility] [place] Procedure visit from 10/30/2023 in [facility] for [facility] at [facility]  PHQ-2 Total Score 0 0 0 0 0  PHQ-9 Total Score -- 0 0 0 --   Flowsheet Row ED from 07/17/2024 in [facility] Emergency Department at [facility] UC  from 11/24/2023 in [facility] at [facility] from 11/17/2023 in [facility]  C-SSRS RISK CATEGORY No Risk No Risk No Risk    Collaboration of Care: see above  Gildo Crisco,  MD Psych Resident, PGY-3 07/29/2024, 9:33 AM

## 2024-07-29 ENCOUNTER — Ambulatory Visit (INDEPENDENT_AMBULATORY_CARE_PROVIDER_SITE_OTHER)

## 2024-07-29 DIAGNOSIS — F429 Obsessive-compulsive disorder, unspecified: Secondary | ICD-10-CM | POA: Diagnosis not present

## 2024-07-29 DIAGNOSIS — F411 Generalized anxiety disorder: Secondary | ICD-10-CM | POA: Diagnosis not present

## 2024-07-29 DIAGNOSIS — F4010 Social phobia, unspecified: Secondary | ICD-10-CM | POA: Diagnosis not present

## 2024-07-29 DIAGNOSIS — Z419 Encounter for procedure for purposes other than remedying health state, unspecified: Secondary | ICD-10-CM | POA: Diagnosis not present

## 2024-07-29 MED ORDER — FLUOXETINE HCL 10 MG PO CAPS: 10.0000 mg | ORAL_CAPSULE | Freq: Every morning | ORAL | 2 refills | Status: DC

## 2024-07-29 MED ORDER — FLUOXETINE HCL 20 MG PO CAPS: 20.0000 mg | ORAL_CAPSULE | Freq: Every day | ORAL | 2 refills | Status: AC

## 2024-08-03 ENCOUNTER — Encounter: Admitting: Family Medicine

## 2024-08-03 DIAGNOSIS — R635 Abnormal weight gain: Secondary | ICD-10-CM

## 2024-08-03 DIAGNOSIS — I10 Essential (primary) hypertension: Secondary | ICD-10-CM

## 2024-08-03 DIAGNOSIS — R7303 Prediabetes: Secondary | ICD-10-CM

## 2024-08-28 DIAGNOSIS — Z419 Encounter for procedure for purposes other than remedying health state, unspecified: Secondary | ICD-10-CM | POA: Diagnosis not present

## 2024-09-28 DIAGNOSIS — Z419 Encounter for procedure for purposes other than remedying health state, unspecified: Secondary | ICD-10-CM | POA: Diagnosis not present

## 2024-10-10 NOTE — Progress Notes (Signed)
 Patient had an appointment today at 10:30 AM, sent her a link, she did not connect.  Waited for her until 10:45 AM.  Her current appointment was marked as a no-show.  Kannen Moxey

## 2024-10-21 ENCOUNTER — Encounter (HOSPITAL_COMMUNITY)

## 2024-10-25 DIAGNOSIS — Z5321 Procedure and treatment not carried out due to patient leaving prior to being seen by health care provider: Secondary | ICD-10-CM | POA: Diagnosis not present

## 2024-10-25 DIAGNOSIS — R519 Headache, unspecified: Secondary | ICD-10-CM | POA: Diagnosis not present

## 2024-10-25 DIAGNOSIS — R110 Nausea: Secondary | ICD-10-CM | POA: Diagnosis not present

## 2024-10-25 DIAGNOSIS — I10 Essential (primary) hypertension: Secondary | ICD-10-CM | POA: Diagnosis not present

## 2024-10-26 ENCOUNTER — Emergency Department (HOSPITAL_COMMUNITY)
Admission: EM | Admit: 2024-10-26 | Discharge: 2024-10-26 | Attending: Emergency Medicine | Admitting: Emergency Medicine

## 2024-10-26 ENCOUNTER — Other Ambulatory Visit: Payer: Self-pay

## 2024-10-26 LAB — COMPREHENSIVE METABOLIC PANEL WITH GFR
ALT: 24 U/L (ref 0–44)
AST: 21 U/L (ref 15–41)
Albumin: 3.7 g/dL (ref 3.5–5.0)
Alkaline Phosphatase: 68 U/L (ref 38–126)
Anion gap: 7 (ref 5–15)
BUN: 10 mg/dL (ref 6–20)
CO2: 24 mmol/L (ref 22–32)
Calcium: 9.1 mg/dL (ref 8.9–10.3)
Chloride: 107 mmol/L (ref 98–111)
Creatinine, Ser: 0.61 mg/dL (ref 0.44–1.00)
GFR, Estimated: >60 mL/min (ref >60)
Glucose, Bld: 97 mg/dL (ref 70–99)
Potassium: 3.8 mmol/L (ref 3.5–5.1)
Sodium: 138 mmol/L (ref 135–145)
Total Bilirubin: 0.5 mg/dL (ref 0.0–1.2)
Total Protein: 7.3 g/dL (ref 6.5–8.1)

## 2024-10-26 LAB — URINALYSIS, ROUTINE W REFLEX MICROSCOPIC
Bilirubin Urine: NEGATIVE
Glucose, UA: NEGATIVE mg/dL
Hgb urine dipstick: NEGATIVE
Ketones, ur: NEGATIVE mg/dL
Leukocytes,Ua: NEGATIVE
Nitrite: NEGATIVE
Protein, ur: NEGATIVE mg/dL
Specific Gravity, Urine: 1.01 (ref 1.005–1.030)
pH: 6 (ref 5.0–8.0)

## 2024-10-26 LAB — CBC
HCT: 39.8 % (ref 36.0–46.0)
Hemoglobin: 12.7 g/dL (ref 12.0–15.0)
MCH: 26.1 pg (ref 26.0–34.0)
MCHC: 31.9 g/dL (ref 30.0–36.0)
MCV: 81.9 fL (ref 80.0–100.0)
Platelets: 394 K/uL (ref 150–400)
RBC: 4.86 MIL/uL (ref 3.87–5.11)
RDW: 13.5 % (ref 11.5–15.5)
WBC: 12.3 K/uL — ABNORMAL HIGH (ref 4.0–10.5)
nRBC: 0 % (ref 0.0–0.2)

## 2024-10-26 LAB — HCG, SERUM, QUALITATIVE: Preg, Serum: NEGATIVE

## 2024-10-26 NOTE — ED Notes (Signed)
 Pt decided to leave while waiting for a room.

## 2024-10-26 NOTE — ED Triage Notes (Signed)
 Pt reports having migraine/nausea that started Sunday night and had no relief after taking meds. Pt states the pain is only on the right side of her head, and sensitive to lights. Hx of HTN and has not been taking meds

## 2024-10-27 ENCOUNTER — Ambulatory Visit
Admission: RE | Admit: 2024-10-27 | Discharge: 2024-10-27 | Disposition: A | Discharge status: Home / Self Care | Source: Ambulatory Visit | Attending: Internal Medicine | Admitting: Internal Medicine

## 2024-10-27 VITALS — BP 112/76 | HR 75 | Temp 97.8°F | Resp 18 | Ht 65.0 in | Wt 200.0 lb

## 2024-10-27 DIAGNOSIS — G43809 Other migraine, not intractable, without status migrainosus: Secondary | ICD-10-CM

## 2024-10-27 DIAGNOSIS — R519 Headache, unspecified: Secondary | ICD-10-CM

## 2024-10-27 MED ORDER — ONDANSETRON 4 MG PO TBDP: 4.0000 mg | ORAL_TABLET | Freq: Three times a day (TID) | ORAL | 0 refills | Status: AC | PRN

## 2024-10-27 MED ORDER — SUMATRIPTAN SUCCINATE 50 MG PO TABS: ORAL_TABLET | ORAL | 0 refills | Status: DC

## 2024-10-27 NOTE — ED Triage Notes (Signed)
 I am having migraines lasting 3 days every other weeks pain is unbearable also with nausea and lightheaded, ibuprofen  and Tylenol  will not help - Entered by patient  Additional information: Patient also reports the migraine on/off but not right now. She also believes she is getting a cold from exposure to her daughter who has one. Last Migraine Sunday night until this morning. Recent PCP change (new patient visit upcoming, December 2025).

## 2024-10-27 NOTE — Discharge Instructions (Addendum)
 Symptoms to sound like recurrent migraines.  As the headache is resolved today we have not done any specific medication in clinic however we we will call in Imitrex and Zofran  to try and help with the headaches if they recur again.  Physical exam findings and vital signs are reassuring here in clinic.  Recommend keeping follow-up appointment as scheduled with primary care for further management of migraine headaches.  We have sent the following medications to your pharmacy: Sumatriptan (Imitrex) 50 mg take 1 tablet at the first signs of a headache and may repeat in 2 hours if the headache persist or recurs.  Do not exceed more than 3 doses in a 24-hour period Zofran  4 mg orally disintegrating tablet every 8 hours as needed for nausea.  Make sure to stay hydrated by drinking plenty of water. Keep follow-up appointment as scheduled with primary care provider for the further management of recurrent migraines May return to urgent care if your migraine returns for more acute treatment.

## 2024-10-27 NOTE — ED Provider Notes (Signed)
 [facility]    CSN: [id number] Arrival date & time: 10/27/24  1838      History   Chief Complaint Chief Complaint  Patient presents with   Migraine    Resolved    HPI Kristi Lowe is a 18 y.o. female.   18 year old female who presents urgent care with complaints of recurrent migraine headaches.  She reports that this has been going on almost every other week.  These migraines can last upwards of 3 days.  She has missed work secondary to this.  The migraines can be severe and are associated with nausea, light sensitivity.  She reports that the last migraine had lasted 3 days however it has gone away now this morning.  She does have a follow-up appointment with primary care at the end of December.  She denies any strokelike symptoms associated with this.  She is still able to do regular activity that is light however she is not able to do her job when she has a severe migraine.   Migraine Associated symptoms include headaches. Pertinent negatives include no chest pain, no abdominal pain and no shortness of breath.    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    Class: Chronic   Encounter for weight loss counseling 11/16/2023   Daytime somnolence 11/16/2023   Snoring 11/16/2023   Insertion of Nexplanon  10/30/2023   Weight gain 04/16/2023   Hypertension 04/16/2023    GAD (generalized anxiety disorder) wo panic attacks 11/03/2021   Headache 10/28/2021   Migraine without aura and without status migrainosus, not intractable 10/28/2021    Past Surgical History:  Procedure Laterality Date   NO PAST SURGERIES      OB History     Gravida  1   Para  1   Term  1   Preterm      AB      Living  1      SAB      IAB      Ectopic      Multiple  0   Live Births  1            Home Medications    Prior to Admission medications  Medication Sig Start Date End Date Taking? Authorizing Provider  FLUoxetine  (PROZAC ) 10 MG capsule Take 1 capsule (10 mg total) by mouth every morning. At the same time as the Prozac  20 mg capsule, for a total of 30 mg every morning Patient taking differently: Take 20 mg by mouth every morning. At the same time as the Prozac  20 mg capsule, for a total of 30 mg every morning 07/29/24  Yes Kapoor, Sahil, MD  FLUoxetine  (PROZAC ) 20 MG capsule Take 1 capsule (20 mg total) by mouth daily. 07/29/24  Yes Kapoor, Sahil, MD  ondansetron  (ZOFRAN -ODT) 4 MG disintegrating tablet Take 1 tablet (4 mg total) by mouth every 8 (eight) hours as needed  for nausea or vomiting. 10/27/24  Yes Shilo Pauwels A, PA-C  SUMAtriptan (IMITREX) 50 MG tablet Take 1 tablet at first signs of headache and may repeat in 2 hours if headache persists or recurs.  Do not exceed more than 3 doses in a 24-hour period 10/27/24  Yes Bibiana Gillean A, PA-C  lisinopril  (PRINIVIL ) 20 MG tablet Take 1 tablet (20 mg total) by mouth daily. 04/25/24   [name], PA    Family History Family History  Problem Relation Age of Onset   Anxiety disorder Mother    ADD / ADHD Mother    Anxiety disorder Maternal Grandmother    Migraines Maternal Grandmother    Aneurysm Maternal Grandmother    Hypertension Paternal Grandfather    Asthma Neg Hx    Cancer Neg Hx    Diabetes Neg Hx    Heart disease Neg Hx    Suicidality Neg Hx        or homicide    Schizophrenia Neg Hx    Bipolar disorder Neg Hx    Drug abuse Neg Hx     Social History Social History[1]   Allergies   Patient has no known allergies.   Review of Systems Review of Systems  Constitutional:  Negative for chills and fever.  HENT:  Negative for ear pain and sore throat.   Eyes:  Negative for pain and visual disturbance.  Respiratory:  Negative for cough and shortness of breath.   Cardiovascular:  Negative for chest pain and palpitations.  Gastrointestinal:  Negative for abdominal pain and vomiting.  Genitourinary:  Negative for dysuria and hematuria.  Musculoskeletal:  Negative for arthralgias and back pain.  Skin:  Negative for color change and rash.  Neurological:  Positive for headaches. Negative for seizures and syncope.  All other systems reviewed and are negative.    Physical Exam Triage Vital Signs ED Triage Vitals  Encounter Vitals Group     BP 10/27/24 1848 112/76     Girls Systolic BP Percentile --      Girls Diastolic BP Percentile --      Boys Systolic BP Percentile --      Boys Diastolic BP Percentile --      Pulse Rate 10/27/24 1848 75     Resp 10/27/24 1848 18     Temp 10/27/24 1848 97.8 F (36.6 C)     Temp Source 10/27/24 1848 Oral     SpO2 10/27/24 1848 98 %     Weight 10/27/24 1846 199 lb 15.3 oz (90.7 kg)     Height 10/27/24 1846 5' 5 (1.651 m)     Head Circumference --      Peak Flow --      Pain Score 10/27/24 1842 0     Pain Loc --      Pain Education --      Exclude from Growth Chart --    No data found.  Updated Vital Signs BP 112/76 (BP Location: Right Arm)   Pulse 75   Temp 97.8 F (36.6 C) (Oral)   Resp 18   Ht 5' 5 (1.651 m)   Wt 199 lb 15.3 oz (90.7 kg)   LMP 10/22/2024   SpO2 98%   BMI 33.27 kg/m   Visual Acuity Right Eye Distance:   Left Eye Distance:   Bilateral Distance:    Right Eye Near:   Left Eye Near:    Bilateral Near:     Physical Exam Vitals and nursing  note reviewed.   Constitutional:      General: She is not in acute distress.    Appearance: She is well-developed.  HENT:     Head: Normocephalic and atraumatic.     Right Ear: Tympanic membrane normal.     Left Ear: Tympanic membrane normal.     Mouth/Throat:     Mouth: Mucous membranes are moist.  Eyes:     Conjunctiva/sclera: Conjunctivae normal.  Cardiovascular:     Rate and Rhythm: Normal rate and regular rhythm.     Heart sounds: No murmur heard. Pulmonary:     Effort: Pulmonary effort is normal. No respiratory distress.     Breath sounds: Normal breath sounds.  Abdominal:     Palpations: Abdomen is soft.     Tenderness: There is no abdominal tenderness.  Musculoskeletal:        General: No swelling.     Cervical back: Neck supple.  Skin:    General: Skin is warm and dry.     Capillary Refill: Capillary refill takes less than 2 seconds.  Neurological:     General: No focal deficit present.     Mental Status: She is alert and oriented to person, place, and time.     Cranial Nerves: No cranial nerve deficit.     Motor: No weakness.     Coordination: Coordination normal.     Gait: Gait normal.  Psychiatric:        Mood and Affect: Mood normal.        Behavior: Behavior normal.        Thought Content: Thought content normal.        Judgment: Judgment normal.      UC Treatments / Results  Labs (all labs ordered are listed, but only abnormal results are displayed) Labs Reviewed - No data to display  EKG   Radiology No results found.  Procedures Procedures (including critical care time)  Medications Ordered in UC Medications - No data to display  Initial Impression / Assessment and Plan / UC Course  I have reviewed the triage vital signs and the nursing notes.  Pertinent labs & imaging results that were available during my care of the patient were reviewed by me and considered in my medical decision making (see chart for details).     Bad headache  Other migraine  without status migrainosus, not intractable   Symptoms to sound like recurrent migraines.  As the headache is resolved today we have not done any specific medication in clinic however we we will call in Imitrex and Zofran  to try and help with the headaches if they recur again.  Physical exam findings and vital signs are reassuring here in clinic.  Recommend keeping follow-up appointment as scheduled with primary care for further management of migraine headaches.  We have sent the following medications to your pharmacy: Sumatriptan (Imitrex) 50 mg take 1 tablet at the first signs of a headache and may repeat in 2 hours if the headache persist or recurs.  Do not exceed more than 3 doses in a 24-hour period Zofran  4 mg orally disintegrating tablet every 8 hours as needed for nausea.  Make sure to stay hydrated by drinking plenty of water. Keep follow-up appointment as scheduled with primary care provider for the further management of recurrent migraines May return to urgent care if your migraine returns for more acute treatment.  Final Clinical Impressions(s) / UC Diagnoses   Final diagnoses:  Bad headache  Other migraine  without status migrainosus, not intractable     Discharge Instructions      Symptoms to sound like recurrent migraines.  As the headache is resolved today we have not done any specific medication in clinic however we we will call in Imitrex and Zofran  to try and help with the headaches if they recur again.  Physical exam findings and vital signs are reassuring here in clinic.  Recommend keeping follow-up appointment as scheduled with primary care for further management of migraine headaches.  We have sent the following medications to your pharmacy: Sumatriptan (Imitrex) 50 mg take 1 tablet at the first signs of a headache and may repeat in 2 hours if the headache persist or recurs.  Do not exceed more than 3 doses in a 24-hour period Zofran  4 mg orally disintegrating tablet every  8 hours as needed for nausea.  Make sure to stay hydrated by drinking plenty of water. Keep follow-up appointment as scheduled with primary care provider for the further management of recurrent migraines May return to urgent care if your migraine returns for more acute treatment.    ED Prescriptions     Medication Sig Dispense Auth. Provider   SUMAtriptan (IMITREX) 50 MG tablet Take 1 tablet at first signs of headache and may repeat in 2 hours if headache persists or recurs.  Do not exceed more than 3 doses in a 24-hour period 10 tablet Adilen Pavelko A, PA-C   ondansetron  (ZOFRAN -ODT) 4 MG disintegrating tablet Take 1 tablet (4 mg total) by mouth every 8 (eight) hours as needed for nausea or vomiting. 20 tablet Teresa Almarie LABOR, [place]      PDMP not reviewed this encounter.    [1]  Social History Tobacco Use   Smoking status: Never    Passive exposure: Never   Smokeless tobacco: Never  Vaping Use   Vaping status: Never Used  Substance Use Topics   Alcohol use: Never   Drug use: Never     Teresa Almarie LABOR, PA-C 10/27/24 1919

## 2024-11-08 ENCOUNTER — Encounter: Payer: Self-pay | Admitting: Family Medicine

## 2024-11-08 ENCOUNTER — Ambulatory Visit: Admitting: Family Medicine

## 2024-11-08 VITALS — BP 128/82 | HR 75 | Temp 98.1°F | Ht 65.0 in | Wt 233.0 lb

## 2024-11-08 DIAGNOSIS — Z975 Presence of (intrauterine) contraceptive device: Secondary | ICD-10-CM | POA: Insufficient documentation

## 2024-11-08 DIAGNOSIS — G43009 Migraine without aura, not intractable, without status migrainosus: Secondary | ICD-10-CM | POA: Diagnosis not present

## 2024-11-08 DIAGNOSIS — I10 Essential (primary) hypertension: Secondary | ICD-10-CM | POA: Diagnosis not present

## 2024-11-08 MED ORDER — PROPRANOLOL HCL ER 60 MG PO CP24: 60.0000 mg | ORAL_CAPSULE | Freq: Every day | ORAL | 1 refills | Status: AC

## 2024-11-08 MED ORDER — SUMATRIPTAN SUCCINATE 50 MG PO TABS: ORAL_TABLET | ORAL | 5 refills | Status: AC

## 2024-11-08 NOTE — Patient Instructions (Addendum)

## 2024-11-08 NOTE — Progress Notes (Addendum)
 "    Patient ID: Kristi Lowe, female  DOB: 2006/02/09, 18 y.o.   MRN: [id number] Patient Care Team    Relationship Specialty Notifications Start End  Catherine Charlies LABOR, DO PCP - General Family Medicine  11/08/24   Cleotilde Ronal RAMAN, MD PCP - OBGYN Obstetrics and Gynecology  05/05/22   Vickie Berber, MD Resident Psychiatry  11/08/24     Chief Complaint  Patient presents with   Establish Care    Medication Management/refill,     Subjective:  Kristi Lowe is a 18 y.o.  female present for transfer of care from prior [place] provider All past medical history, surgical history, allergies, family history, immunizations, medications and social history were updated in the electronic medical record today. All recent labs, ED visits and hospitalizations within the last year were reviewed.  Hypertension: Patient reports she was prescribed lisinopril  20 mg daily after the birth of her child in 2023 in which she had postpartum hypertension.  She had been on this medication until about 2-3 months ago, secondary to not being able to get refills. She reports when she checks her blood pressure at home it typically is around 130/80.  She has noticed an increased frequency of headaches over the last 2 to 3 months.  States she typically never had migraine headaches, but has developed them over the last few months.  Denies any changes in diet, sleep schedule,'stress levels or hormone replacement.  Patient had a Nexplanon  placed in 10/2023.  She was seen at the urgent care and provided with Imitrex , which she has not had to use yet. Patient reports her eye exam was normal within the last 6 months     07/29/2024    9:29 AM 12/28/2023    8:53 AM 11/17/2023    1:10 PM 11/16/2023   10:10 AM 10/30/2023   10:28 AM  Depression screen PHQ 2/9  Decreased Interest  0  0 0  Down, Depressed, Hopeless  0  0 0  PHQ - 2 Score  0  0 0  Altered sleeping  0  0   Tired, decreased energy  0  0   Change in appetite  0  0    Feeling bad or failure about yourself   0  0   Trouble concentrating  0  0   Moving slowly or fidgety/restless  0  0   Suicidal thoughts  0  0   PHQ-9 Score  0   0    Difficult doing work/chores  Not difficult at all  Not difficult at all      Information is confidential and restricted. Go to Review Flowsheets to unlock data.   Data saved with a previous flowsheet row definition      12/28/2023    8:53 AM 11/17/2023    1:10 PM 11/16/2023   10:10 AM 06/24/2023    8:50 AM  GAD 7 : Generalized Anxiety Score  Nervous, Anxious, on Edge 1  1 2   Control/stop worrying 0  0 1  Worry too much - different things 1  1 1   Trouble relaxing 0  0 0  Restless 0  0 0  Easily annoyed or irritable 0  0 0  Afraid - awful might happen 0  0 0  Total GAD 7 Score 2  2 4   Anxiety Difficulty Not difficult at all  Not difficult at all Not difficult at all     Information is confidential and restricted. Go to Review Flowsheets to  unlock data.    Immunization History  Administered Date(s) Administered   Dtap, Unspecified 04/22/2004, 07/04/2004, 09/18/2004, 09/30/2005, 03/12/2009   HIB, Unspecified 04/22/2004, 07/04/2004, 09/18/2004, 03/04/2005   HPV 9-valent 08/20/2016, 08/17/2017   Hep A, Unspecified 03/12/2009   Hep B, Unspecified 02/16/2004, 03/20/2004, 12/23/2004   Hepatitis A, Ped/Adol-2 Dose 09/11/2009   Influenza, Seasonal, Injecte, Preservative Fre 09/11/2009, 01/12/2010   Influenza-Unspecified 04/22/2004, 07/04/2004, 03/12/2009, 09/10/2023, 09/19/2024   MMR 03/04/2005, 03/12/2009, 10/20/2022   MenQuadfi_Meningococcal Groups ACYW Conjugate 09/09/2021   Meningococcal Conjugate 08/20/2016   Pneumococcal-Unspecified 04/22/2004, 07/04/2004, 09/18/2004, 03/04/2005   Polio, Unspecified 04/22/2004, 07/04/2004, 09/30/2005, 03/12/2009   Tdap 08/07/2016, 04/22/2019, 07/16/2022   Varicella 09/30/2005, 03/12/2009    No results found.  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   Rubella non-immune status, antepartum    Snoring 11/16/2023   Social anxiety disorder 11/17/2023   Allergies[1] Past Surgical History:  Procedure Laterality Date   NO PAST SURGERIES     Family History  Problem Relation Age of Onset   Anxiety disorder Mother    ADD / ADHD Mother    Anxiety disorder Maternal Grandmother    Migraines Maternal Grandmother    Aneurysm Maternal Grandmother    Hypertension Paternal Grandfather    Asthma Neg Hx    Cancer Neg Hx    Diabetes Neg Hx    Heart disease Neg Hx    Suicidality Neg Hx        or homicide   Schizophrenia Neg Hx    Bipolar disorder Neg Hx    Drug abuse Neg Hx    Social History   Social History Narrative   Akshita lives with mom, dad, and sister.    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 She does not the best, but not the worst in school.    She would like to go to a community college and the transfer to a university for public relations account executive.       At 17yo (2025), lives alone with boyfriend of 6 years + daughter (born 2024)   Currently taking college courses online, with a goal of EMS program   Working as well at a print shop   Family is nearby and supportive    Allergies as of 11/08/2024   No Known Allergies      Medication List        Accurate as of November 08, 2024 11:11 AM. If you have any questions, ask your nurse or doctor.          STOP taking these medications    lisinopril  20 MG tablet Commonly known as: Prinivil  Stopped by: Charlies Bellini, DO       TAKE these medications    FLUoxetine  20 MG capsule Commonly known as: PROzac  Take 1 capsule (20 mg total) by mouth daily. What changed: Another medication with the same name was removed. Continue taking this medication, and follow the  directions you see here. Changed by: Charlies Bellini, DO   ondansetron  4 MG disintegrating tablet Commonly known as: ZOFRAN -ODT Take 1 tablet (4 mg total) by mouth every 8 (eight) hours as needed for nausea or vomiting.   propranolol  ER 60 MG 24 hr capsule Commonly known as: Inderal  LA Take 1 capsule (60 mg total) by mouth daily. Started  by: Charlies Bellini, DO   SUMAtriptan  50 MG tablet Commonly known as: Imitrex  Take 1 tablet at first signs of headache and may repeat in 2 hours if headache persists or recurs.  Do not exceed more than 3 doses in a 24-hour period        All past medical history, surgical history, allergies, family history, immunizations andmedications were updated in the EMR today and reviewed under the history and medication portions of their EMR.     No results found.   ROS 14 pt review of systems performed and negative (unless mentioned in an HPI)  Objective: BP 128/82   Pulse 75   Temp 98.1 F (36.7 C)   Ht 5' 5 (1.651 m)   Wt 233 lb (105.7 kg)   LMP 10/31/2024   SpO2 98%   BMI 38.77 kg/m  Physical Exam Vitals and nursing note reviewed.  Constitutional:      General: She is not in acute distress.    Appearance: Normal appearance. She is not ill-appearing, toxic-appearing or diaphoretic.  HENT:     Head: Normocephalic and atraumatic.  Eyes:     General: No scleral icterus.       Right eye: No discharge.        Left eye: No discharge.     Extraocular Movements: Extraocular movements intact.     Conjunctiva/sclera: Conjunctivae normal.     Pupils: Pupils are equal, round, and reactive to light.  Cardiovascular:     Rate and Rhythm: Normal rate and regular rhythm.     Heart sounds: No murmur heard. Pulmonary:     Effort: Pulmonary effort is normal. No respiratory distress.     Breath sounds: Normal breath sounds. No wheezing, rhonchi or rales.  Musculoskeletal:     Cervical back: Neck supple.     Right lower leg: No edema.     Left lower leg:  No edema.  Lymphadenopathy:     Cervical: No cervical adenopathy.  Skin:    General: Skin is warm.     Findings: No rash.  Neurological:     Mental Status: She is alert and oriented to person, place, and time. Mental status is at baseline.     Motor: No weakness.     Gait: Gait normal.  Psychiatric:        Mood and Affect: Mood normal.        Behavior: Behavior normal.        Thought Content: Thought content normal.        Judgment: Judgment normal.      Assessment/plan: Kristi Lowe is a 18 y.o. female present for TOC Hypertension, unspecified type (Primary) Patient has been without medications for greater than 2 months.  Blood pressure is borderline today.  She is having increased frequency of migraines which is fairly new for her the last few months. Will start Inderal  60 mg daily and bring her back in 2-3 months for follow-up and CPE.  Migraine without aura and without status migrainosus, not intractable Patient will start migraine diary/app to assist in attempting to identify triggers. Start Inderal  60 daily.  She hopefully will find benefit with help decrease migraine headaches, with added benefits of helping with her blood pressure and social anxiety disorder. Will bring her back for close follow-up after starting Inderal  and complete CPE.  If migraines are not improving, would consider neurology referral at that time.  Return in about 10 weeks (around 01/17/2025) for cpe (20 min), Routine chronic condition  follow-up.  No orders of the defined types were placed in this encounter.  Meds ordered this encounter  Medications   SUMAtriptan  (IMITREX ) 50 MG tablet    Sig: Take 1 tablet at first signs of headache and may repeat in 2 hours if headache persists or recurs.  Do not exceed more than 3 doses in a 24-hour period    Dispense:  10 tablet    Refill:  5   propranolol  ER (INDERAL  LA) 60 MG 24 hr capsule    Sig: Take 1 capsule (60 mg total) by mouth daily.    Dispense:  90  capsule    Refill:  1   Referral Orders  No referral(s) requested today     Note is dictated utilizing voice recognition software. Although note has been proof read prior to signing, occasional typographical errors still can be missed. If any questions arise, please do not hesitate to call for verification.  Electronically signed by: Charlies Bellini, DO [facility]     [1] No Known Allergies  "

## 2024-11-09 ENCOUNTER — Encounter: Payer: Self-pay | Admitting: Family Medicine

## 2025-01-17 ENCOUNTER — Encounter: Admitting: Family Medicine
